# Patient Record
Sex: Male | Born: 1980 | Hispanic: Yes | Marital: Married | Smoking: Current every day smoker
Health system: Southern US, Community
[De-identification: ages and names within clinical notes are randomized; demographics above are authoritative.]

## PROBLEM LIST (undated history)

## (undated) DIAGNOSIS — R569 Unspecified convulsions: Secondary | ICD-10-CM

---

## 2015-07-30 ENCOUNTER — Encounter (HOSPITAL_BASED_OUTPATIENT_CLINIC_OR_DEPARTMENT_OTHER): Payer: Self-pay | Admitting: *Deleted

## 2015-07-30 ENCOUNTER — Emergency Department (HOSPITAL_BASED_OUTPATIENT_CLINIC_OR_DEPARTMENT_OTHER)
Admission: EM | Admit: 2015-07-30 | Discharge: 2015-07-30 | Disposition: A | Discharge status: Home / Self Care | Payer: Self-pay | Attending: Emergency Medicine | Admitting: Emergency Medicine

## 2015-07-30 DIAGNOSIS — F17210 Nicotine dependence, cigarettes, uncomplicated: Secondary | ICD-10-CM | POA: Insufficient documentation

## 2015-07-30 DIAGNOSIS — R200 Anesthesia of skin: Secondary | ICD-10-CM | POA: Insufficient documentation

## 2015-07-30 DIAGNOSIS — M5410 Radiculopathy, site unspecified: Secondary | ICD-10-CM

## 2015-07-30 DIAGNOSIS — M4726 Other spondylosis with radiculopathy, lumbar region: Secondary | ICD-10-CM | POA: Insufficient documentation

## 2015-07-30 MED ORDER — IBUPROFEN 400 MG PO TABS
600.0000 mg | ORAL_TABLET | Freq: Once | ORAL | Status: AC
  Administered 2015-07-30: 600 mg via ORAL
  Filled 2015-07-30: qty 1

## 2015-07-30 MED ORDER — PREDNISONE 10 MG PO TABS: 60.0000 mg | ORAL_TABLET | Freq: Every day | ORAL | Status: DC

## 2015-07-30 MED ORDER — DIAZEPAM 5 MG PO TABS
5.0000 mg | ORAL_TABLET | Freq: Once | ORAL | Status: AC
  Administered 2015-07-30: 5 mg via ORAL
  Filled 2015-07-30: qty 1

## 2015-07-30 MED ORDER — HYDROCODONE-ACETAMINOPHEN 5-325 MG PO TABS: 1.0000 | ORAL_TABLET | Freq: Four times a day (QID) | ORAL | Status: DC | PRN

## 2015-07-30 MED ORDER — IBUPROFEN 600 MG PO TABS: 600.0000 mg | ORAL_TABLET | Freq: Four times a day (QID) | ORAL | Status: DC | PRN

## 2015-07-30 MED ORDER — METHOCARBAMOL 500 MG PO TABS: 500.0000 mg | ORAL_TABLET | Freq: Two times a day (BID) | ORAL | Status: DC

## 2015-07-30 MED ORDER — HYDROCODONE-ACETAMINOPHEN 5-325 MG PO TABS
2.0000 | ORAL_TABLET | Freq: Once | ORAL | Status: AC
  Administered 2015-07-30: 2 via ORAL
  Filled 2015-07-30: qty 2

## 2015-07-30 NOTE — ED Notes (Signed)
C/o sneezing last pm and felt a crack in low back and pain is going down right leg.

## 2015-07-30 NOTE — Discharge Instructions (Signed)
We saw you in the ER for back pain, leg pain. We suspect disk disease or arthritis with impingement of the nerve causing the leg pain. Your pain improved and you are able to ambulate - so we are discharging you.  Please take the ibuprofen every 6 hours for the next 2 days, take the muscle relaxant as needed, and see your primary care doctor for further pain control.  Please use the back exercises to strengthen the back muscles.  Please return to the ER if your symptoms worsen; you have increased pain, numbness, weakness, urinary incontinence, urinary retention, bowel incontinence, tingling by your groin. [facility].  RESOURCE GUIDE  Chronic Pain Problems: Contact [facility]  [phone] Patients need to be referred by their primary care doctor.  Insufficient Money for Medicine: Contact United Way:  call "211."   No Primary Care Doctor: - Call [facility]  [phone] - can help you locate a primary care doctor that  accepts your insurance, provides certain services, etc. - Physician Referral Service- [phone]  Agencies that provide inexpensive medical care: - [facility]  [phone] - [facility]  [phone] - [facility]  [phone] - Women's Clinic  [phone] - [facility]  [phone] [facility]  [phone]  Medicaid-accepting [place] Providers: - [facility]- [address]  [phone], Mon-Fri 9am-7pm, Sat 9am-1pm - [facility]- [address] [place], [address] [place]  [phone] - [facility]- [address] [place]  [phone] [facility]- [address]  [phone] - Renaye Rakers- [address] [place], [address], [phone]  Only accepts [place] patients after they have their name  applied to their card  Self Pay (no insurance) in [place]: - Sickle Cell Patients: Dr Willey Blade, [facility]  [address] [place], [phone] - [facility]- [address] [place]  [phone]       [facility]- [address]       -     [facility]- see information above (Speak to [employer] if you do not have insurance)       -  [facility]- [address],  [phone]       -  [facility]- [address], [phone]       -  Dr Julio Sicks-  [address], [place], [phone]       -  [facility] - [address] [phone]       -  [facility]- [address] [phone], also [address] [phone]       -    [facility]- [address] [place], [phone], 1st & 3rd Saturday        every month, 10am-1pm  [facility] [address] [place], [place] [address] [phone]  [facility] [address] [place], [place] [address] [phone]  1) Find a Doctor and Pay Out of Pocket Although you won't have to find out who is covered by your insurance plan, it is a good idea to ask around and get recommendations. You will then need to call the office and see if the doctor  you have chosen will accept you as a new patient and what types of options they offer for patients who are self-pay. Some doctors offer discounts or will set up payment plans for their patients who do not have insurance, but you will need to ask so you aren't surprised when you get to your appointment.  2) Contact Your Local Health Department Not all health departments have doctors that can see patients for sick visits, but many do, so it is worth a call to see if yours does. If you don't know where your local health department is, you can check in your phone book. The CDC also has a tool to help you locate your state's health department, and many state websites also have listings of all of their local health  departments.  3) Find a Walk-in Clinic If your illness is not likely to be very severe or complicated, you may want to try a walk in clinic. These are popping up all over the country in pharmacies, drugstores, and shopping centers. They're usually staffed by nurse practitioners or physician assistants that have been trained to treat common illnesses and complaints. They're usually fairly quick and inexpensive. However, if you have serious medical issues or chronic medical problems, these are probably not your best option  STD Testing - [facility] Department of [facility], STD Clinic, [address] [place], phone [phone] or [phone].  Monday - Friday, call for an appointment. [facility] Department of [employer], STD Clinic, [place] [address], [place], phone [phone] or [phone].  Monday - Friday, call for an appointment.  Abuse/Neglect: [facility] Child Abuse Hotline [phone] [facility] Child Abuse Hotline 770-234-2455 (After Hours)  Emergency Shelter:  [facility] [phone]  Maternity Homes: - [facility] [phone] - [facility] [phone]  MRSA Hotline #:   367-283-4477  Dental Assistance If unable to pay or uninsured, contact:  [facility]. to become qualified for the adult dental clinic.  Patients with Medicaid: [facility] [phone] [address], [phone] [address] [phone]  If unable to pay, or uninsured, contact [facility] [phone] in [place], [phone] in [facility]) to become qualified for the adult dental clinic  [facility] [address] [place], [place] [address] [phone] [email or web address]  Other Proofreader Services: - [facility]- [address] [place], [place], [place], [address], [phone], Ext. [phone], 2nd and 4th Thursday of the  month at 6:30am.  10 clients each day by appointment, can sometimes see walk-in patients if someone does not show for an appointment. [facility]- [address] [place], [place], [address], [phone] - [facility]- [address] [place], [place], [address], [phone] [facility] Health Department- [phone] [facility] Health Department- [phone] [facility] [phone]          Ejercicios para la espalda (Back Exercises) Los siguientes ejercicios fortalecen los msculos que dan soporte a la espalda y, [place], ayudan a Pharmacologist la flexibilidad de la zona lumbar. Hacer estos ejercicios puede ser de ayuda para Psychologist, sport and exercise de espalda o Engineer, materials actual. Si tiene dolor o molestias en la espalda, intente hacer estos ejercicios 2 o 3veces por da, o como se lo haya indicado el mdico. Cuando el dolor desaparezca, hgalos una vez por da, pero haga ms  repeticiones de cada ejercicio. Si no tiene dolor o [employer] espalda, haga estos ejercicios una vez por da o como se lo haya indicado el mdico. EJERCICIOS Rodilla al pecho Repita estos pasos 3 o 5veces con cada pierna:  Acustese boca arriba sobre una cama dura o sobre el suelo con las piernas extendidas.  Lleve una rodilla al pecho. La otra pierna debe quedar extendida y en contacto con el suelo.  Mantenga la rodilla contra el pecho. Para lograrlo tmese la rodilla o el muslo.  Tire de la rodilla hasta sentir una elongacin suave en la parte baja de la espalda.  Mantenga la elongacin durante 10 a 30segundos.  Suelte y extienda la pierna lentamente. Inclinacin de la pelvis Repita estos pasos 5 o 10veces:  Acustese boca arriba sobre una cama dura o sobre el suelo con las piernas extendidas.  Flexione las rodillas de modo que apunten al techo y los pies queden apoyados en el suelo.  Contraiga los msculos de la parte baja del abdomen para  empujar la zona lumbar contra el suelo. Con este movimiento se inclinar la pelvis de modo que el cccix apunte hacia el techo, en lugar de apuntar en direccin a los pies o al suelo.  Contraiga suavemente y respire con normalidad mientras mantiene esta posicin durante 5 a 10segundos. El perro y el gato Repita estos pasos hasta que la zona lumbar se vuelva ms flexible:  Apoye las palmas de las manos y las rodillas sobre una superficie firme. Las manos deben estar alineadas con los hombros y las rodillas con las caderas. Puede colocarse almohadillas debajo de las rodillas para estar cmodo.  Deje caer la cabeza y baje el cccix en direccin al suelo de modo que la zona lumbar se arquee como el lomo de un [place].  Mantenga esta posicin durante 5segundos.  Lentamente, levante la cabeza y eleve el cccix de modo que apunte en direccin al techo para que la espalda forme un arco hundido como el lomo de un perro contento.  Mantenga esta posicin durante 5segundos. Flexiones de Public house manager pasos 5 o 10veces:  Acustese boca abajo en el suelo.  Coloque las palmas de las manos cerca de la cabeza, separadas aproximadamente al ancho de los hombros.  Con la espalda lo ms relajada posible y las caderas apoyadas en el suelo, extienda lentamente los brazos para levantar la mitad superior del cuerpo y Optometrist los hombros. No use los msculos de la espalda para elevar la parte superior del torso. Puede cambiar las manos de lugar para estar ms cmodo.  Mantenga esta posicin durante 5segundos mientras mantiene la espalda relajada.  Lentamente vuelva a la posicin horizontal. Puentes Repita estos pasos 10veces:  Acustese boca arriba sobre una superficie firme.  Flexione las rodillas de modo que apunten al techo y los pies queden apoyados en el suelo.  Contraiga los glteos y despegue las nalgas del suelo hasta que la cintura est casi a la misma altura que las rodillas. Debe  sentir el trabajo muscular en las nalgas y la parte posterior de los muslos. Si no siente el esfuerzo de [employer], aleje los pies 1 o 2pulgadas (2,5 o 5centmetros) de las nalgas.  Mantenga esta posicin durante 3 a 5segundos.  Baje lentamente las caderas a la posicin inicial y relaje los glteos por completo. Si este ejercicio le resulta muy fcil, intente realizarlo con los brazos cruzados [place]. Abdominales Repita estos pasos 5 o 10veces:  Acustese boca  arriba sobre una cama dura o sobre el suelo con las piernas extendidas.  Flexione las rodillas de modo que apunten al techo y los pies queden apoyados en el suelo.  Cruce los [employer] Corporation.  Baje levemente el mentn en direccin al pecho sin doblar el cuello.  Contraiga los msculos del abdomen y con lentitud eleve el torso lo suficiente como para Artist los omplatos del suelo. No eleve el torso ms alto que eso, porque esto puede sobreexigir a la zona lumbar y no ayuda a Investment banker, operational.  Regrese lentamente a la posicin inicial. Elevaciones de espalda Repita estos pasos 5 o 10veces:  Acustese boca abajo con los brazos a los costados del cuerpo y apoye la frente en el suelo.  Contraiga los msculos de las piernas y los glteos.  Lentamente despegue el pecho del suelo mientras mantiene las caderas bien apoyadas en el suelo. Mantenga la nuca alineada con la curvatura de la espalda. Los ojos deben mirar al suelo.  Mantenga esta posicin durante 3 a 5segundos.  Regrese lentamente a la posicin inicial. SOLICITE ATENCIN MDICA SI:  El dolor o las molestias en la espalda se vuelven mucho ms intensos cuando hace un ejercicio.  El dolor o las molestias en la espalda no se Copy en el trmino de las 2horas posteriores a Copy. Si tiene alguno de [employer], deje de [employer] ejercicios de inmediato. No vuelva a hacer los ejercicios a menos que el  mdico lo autorice. SOLICITE ATENCIN MDICA DE INMEDIATO SI:  Siente un dolor sbito e intenso en la espalda. Si esto ocurre, deje de [employer] ejercicios de inmediato. No vuelva a hacer los ejercicios a menos que el mdico lo autorice.   Esta informacin no tiene Theme park manager el consejo del mdico. Asegrese de hacerle al mdico cualquier pregunta que tenga.   Document Released: 05/31/2005 Document Revised: 02/19/2015 Elsevier Interactive Patient Education 2016 [employer].  Dolor Radicular (Radicular Pain) El dolor radicular est causado en la irritacin de las races [place]. Generalmente la causa es una degeneracin el un disco de la columna vertebral. Esto produce dolor que se siente en el brazo o la pierna. Entre otras causas del dolor radicular se incluyen:  Nurse, children's  Neuropatas (un estado anormal y a menudo degenerativo del sistema nervioso o nervios). Para el diagnstico le indicarn una tomografa computarizada o imgenes por resonancia magntica para determinar la causa primaria. Los nervios que comienzan en el cuello (races nerviosas) pueden Doctor, general practice radicular en la parte exterior del hombro y en el brazo. ste puede extenderse hacia el pulgar y los dedos. Los sntomas varan segn la raz nerviosa afectada. En la mayor parte de los casos mejora en gran medida con un tratamiento conservador. Para los problemas en el cuello le indicarn fisioterapia, un collar o una traccin cervical. El tratamiento puede durar varias semanas y se considerar la posibilidad de una ciruga si los sntomas no mejoran.  El tratamiento conservador tambin se recomienda para los casos de citica que causan dolor que se irradia desde la zona baja de la espalda o nalgas hacia la pierna o el pie. Generalmente hay una historia previa de problemas en la espalda. La mayora de los pacientes mejora completamente luego de 2 a 4 semanas de reposo en cama y  otros tratamientos de apoyo. El reposo en cama reduce la presin en el disco considerablemente. El estar sentado no es una buena posicin,  ya que aumenta la presin [employer] disco. Evite encorvarse, levantar mucho peso, Personal assistant sentado por [place], y las actividades que puedan empeorar el problema. El los casos ms graves se Magazine features editor traccin. La ciruga est reservada para los [address] no mejoran dentro de los primeros meses de [place]. Utilice los medicamentos de venta libre o de prescripcin para Chief Technology Officer, Environmental health practitioner o la [place], segn se lo indique el profesional que lo asiste. Los narcticos y los relajantes musculares ayudan a Teacher, early years/pre dolor ms intenso y los espasmos, proporcionando una sedacin [place]. Tressie Stalker de fro o [place] tambin le brindarn [place]. No se recomienda la manipulacin de la columna vertebral. Esto puede aumentar el grado de protrusin del disco. Las inyecciones epidurales con esteroides son a menudo un tratamiento efectivo para el dolor radicular. Esas inyecciones colocan medicamentos al nervio espinal en el espacio entre la cubierta protectora de la mdula espinal y los huesos de la espalda (vertebrae). El profesional que lo asiste podr darle ms informacin acerca de las inyecciones de esteroides. Estas inyecciones son ms efectivas cuando se administran dentro de las [employer] de la aparicin del Engineer, mining.  Comunquese con su mdico para realizar un control segn las indicaciones. Como parte importante del tratamiento deber seguir un programa de rehabilitacin, con ejercicios de elongacin y fortalecimiento.  SOLICITE ATENCIN MDICA DE INMEDIATO SI:  Presenta siente debilidad o adormecimiento inusual en sus brazos o piernas.  Presenta prdida del control del intestino o de la vejiga.  Presenta dolor abdominal.   Esta informacin no tiene como fin reemplazar el consejo del mdico. Asegrese de hacerle al mdico cualquier pregunta que tenga.     Document Released: 05/31/2005 Document Revised: 08/23/2011 Elsevier Interactive Patient Education 2016 Elsevier Inc.  Radiculopata lumbosacra (Lumbosacral Radiculopathy) La radiculopata lumbosacra es una afeccin que compromete los nervios raqudeos, y las races nerviosas de la parte baja de la espalda y Chief Operating Officer. La afeccin se manifiesta cuando estos nervios y las races nerviosas se desplazan o se inflaman y causan sntomas. CAUSAS Este trastorno puede ser causado por:  Presin en un disco que sobresale de su lugar (hernia de disco). Un disco es una placa de cartlago que separa los huesos de la columna.  Degeneracin discal.  Un estrechamiento de los huesos de la parte baja de la espalda (estenosis del conducto raqudeo).  Un tumor.  Una infeccin.  Una lesin que ejerce una presin repentina en los discos que amortiguan los huesos de la parte ms baja de la columna. FACTORES DE RIESGO Es ms probable que esta afeccin se manifieste en:  Los hombres que [employer] Holdings 30 y [id number].  Las mujeres que tienen entre 50 y 60AVW.  Las personas que levantan cargas de forma inadecuada.  Las personas con sobrepeso o que llevan un estilo de vida sedentario.  Los fumadores.  Las personas que realizan actividades repetitivas que tensionan la columna vertebral. SNTOMAS Los sntomas de esta afeccin incluyen lo siguiente:  Dolor que baja por la espalda hasta las piernas (citica). Este es el sntoma ms frecuente. El dolor puede empeorar al sentarse, toser o estornudar.  Dolor y Physicist, medical los brazos y la piernas.  Debilidad muscular.  Hormigueo.  Prdida del control del intestino o de la vejiga. DIAGNSTICO Esta afeccin se diagnostica mediante un examen fsico y la historia clnica. Si el dolor es prolongado, pueden hacerle estudios, por ejemplo:  Health visitor.  Radiografas.  Tomografa computarizada.  Mielografa.  Estudio de [employer]. TRATAMIENTO  Esta afeccin suele tratarse con lo siguiente:  Aplicacin de compresas calientes y fras en las zonas afectadas.  Estiramientos para mejorar la flexibilidad.  Ejercicios para fortalecer los msculos de la espalda.  Fisioterapia.  Analgsicos.  Una inyeccin de corticoides en la columna vertebral. En algunos los casos, no se necesita tratamiento. Si la afeccin es de larga duracin (crnica) o si los sntomas son intensos, el tratamiento puede incluir ciruga o cambios en el estilo de vida, como seguir un plan para bajar de peso. INSTRUCCIONES PARA EL CUIDADO EN EL HOGAR Medicamentos  Tome los medicamentos solamente como se lo haya indicado el mdico.  No conduzca ni opere maquinaria pesada mientras toma analgsicos. Cuidado de la lesin  Aplique una compresa caliente en la zona lesionada como le haya indicado el mdico.  Aplique hielo en la zona afectada:  Ponga el hielo en una bolsa plstica.  Coloque una toalla entre la piel y la bolsa de hielo.  Deje el hielo durante 20 a , cada 2horas, mientras est despierto o cuando lo necesite. O bien, djelo durante todo el [employer] le haya indicado el mdico. Otras indicaciones  Si le explicaron cmo hacer ejercicios o estiramientos, hgalos como se lo haya indicado el mdico.  Si el mdico le recomend una dieta o un programa de ejercicios, cmplalos como se lo hayan indicado.  Concurra a todas las visitas de control como se lo haya indicado el mdico. Esto es importante. SOLICITE ATENCIN MDICA SI:  El dolor no mejora con el tiempo, incluso si est tomando analgsicos. SOLICITE ATENCIN MDICA DE INMEDIATO SI:  Siente un dolor intenso.  El dolor empeora repentinamente.  Aumenta la debilidad en las piernas.  Pierde la capacidad de controlar la vejiga o el intestino.  Tiene dificultad para caminar o mantener el equilibrio.  Tiene fiebre.   Esta informacin no tiene Microbiologist el consejo del mdico. Asegrese de hacerle al mdico cualquier pregunta que tenga.   Document Released: 03/10/2005 Document Revised: 10/15/2014 Elsevier Interactive Patient Education [employer]

## 2015-07-30 NOTE — ED Provider Notes (Signed)
CSN: [id number]     Arrival date & time 07/30/15  4098 History   First MD Initiated Contact with Patient 07/30/15 0940     Chief Complaint  Patient presents with  . Back Pain     (Consider location/radiation/quality/duration/timing/severity/associated sxs/prior Treatment) HPI Comments: Pt comes in with cc of back pain and leg pain. Pt was on the cough yday, has a seemingly violent sneeze. Post sneezing pt started having severe back pain and leg pain. Pt's pain moves down the back into the anterior thigh and down the leg to the toes. The pain is sharp. He has some numbness with the pain in the anterior part as well. No associated weakness, urinary incontinence, urinary retention, bowel incontinence, saddle anesthesia. Pt was having difficulty getting out of the bed today, so he was brought tot he ER. Tylenol tried at home with minimal relief. Pt has no hx of back problems.   ROS 10 Systems reviewed and are negative for acute change except as noted in the HPI.       The history is provided by the patient.    History reviewed. No pertinent past medical history. History reviewed. No pertinent past surgical history. No family history on file. Social History  Substance Use Topics  . Smoking status: Current Every Day Smoker -- 0.50 packs/day    Types: Cigarettes  . Smokeless tobacco: None  . Alcohol Use: None    Review of Systems    Allergies  Review of patient's allergies indicates no known allergies.  Home Medications   Prior to Admission medications   Medication Sig Start Date End Date Taking? Authorizing Provider  HYDROcodone-acetaminophen (NORCO/VICODIN) 5-325 MG tablet Take 1 tablet by mouth every 6 (six) hours as needed. 07/30/15   Derwood Kaplan, MD  ibuprofen (ADVIL,MOTRIN) 600 MG tablet Take 1 tablet (600 mg total) by mouth every 6 (six) hours as needed. 07/30/15   Derwood Kaplan, MD  methocarbamol (ROBAXIN) 500 MG tablet Take 1 tablet (500 mg total) by mouth 2 (two)  times daily. 07/30/15   Derwood Kaplan, MD  predniSONE (DELTASONE) 10 MG tablet Take 6 tablets (60 mg total) by mouth daily. 07/30/15   Careena Degraffenreid Rhunette Croft, MD   BP 91/69 mmHg  Pulse 74  Temp(Src) 97.9 F (36.6 C) (Oral)  Resp 18  Ht  (1.727 m)  Wt 205 lb (92.987 kg)  BMI 31.18 kg/m2  SpO2 100% Physical Exam  Constitutional: He is oriented to person, place, and time. He appears well-developed.  HENT:  Head: Normocephalic and atraumatic.  Eyes: Conjunctivae and EOM are normal. Pupils are equal, round, and reactive to light.  Neck: Normal range of motion. Neck supple.  Cardiovascular: Normal rate, regular rhythm and normal heart sounds.   Pulmonary/Chest: Effort normal and breath sounds normal. No respiratory distress. He has no wheezes.  Abdominal: Soft. Bowel sounds are normal. He exhibits no distension. There is no tenderness. There is no guarding.  Musculoskeletal:  Pt has tenderness over the lumbar region No step offs, no erythema. Pt has 2+ patellar reflex bilaterally bilaterally -no hyperreflexia noted. Able to discriminate between sharp and dull. Pt has subjective numbness in the anterior thigh - but distal to that sensation is normal and equal to the contralateral side.    Neurological: He is alert and oriented to person, place, and time.  Skin: Skin is warm.  Nursing note and vitals reviewed.   ED Course  Procedures (including critical care time)  :40 pm: pt is not sitting upright.  He is able to lay flat and get up on his own. Pain still present, hasnt completely resolved, but he feels a lot better. Strict ER return precautions discussed. F/u info provided.  Labs Review Labs Reviewed - No data to display  Imaging Review No results found. I have personally reviewed and evaluated these images and lab results as part of my medical decision-making.   EKG Interpretation None      MDM   Final diagnoses:  Radicular pain  Osteoarthritis of spine with  radiculopathy, lumbar region    Pt comes in with cc of back pain. Sudden onset, after sneezing, with some numbness and pain radiating to the leg.  DDx includes: - DJD of the back - Spondylitises/ spondylosis - Disk hernia - Sciatica - Spinal cord compression - Conus medullaris - Myelitis - Musculoskeletal pain  Based on the exam - disk hernia, rupture and radicular pain from DJD possible. Pt able to get up and ambulate, he is limping. When laying supine, he is comfortable, sitting up hurts. Has no insurance or pcp.  Plan is to give him oral meds and reassess. If pain and pt improve dramatically, we will d/c with strict return precautions to cone and f/u info. If pt doesn't improve - we will transfer him to [facility] for MRI to r/o disk rupture.      Derwood Kaplan, MD 07/30/15 1222

## 2021-10-15 ENCOUNTER — Emergency Department (HOSPITAL_BASED_OUTPATIENT_CLINIC_OR_DEPARTMENT_OTHER): Payer: Self-pay

## 2021-10-15 ENCOUNTER — Emergency Department (HOSPITAL_BASED_OUTPATIENT_CLINIC_OR_DEPARTMENT_OTHER)
Admission: EM | Admit: 2021-10-15 | Discharge: 2021-10-15 | Disposition: A | Discharge status: Home / Self Care | Payer: Self-pay | Attending: Emergency Medicine | Admitting: Emergency Medicine

## 2021-10-15 ENCOUNTER — Other Ambulatory Visit: Payer: Self-pay

## 2021-10-15 ENCOUNTER — Encounter (HOSPITAL_BASED_OUTPATIENT_CLINIC_OR_DEPARTMENT_OTHER): Payer: Self-pay

## 2021-10-15 DIAGNOSIS — M25571 Pain in right ankle and joints of right foot: Secondary | ICD-10-CM | POA: Insufficient documentation

## 2021-10-15 DIAGNOSIS — S82891A Other fracture of right lower leg, initial encounter for closed fracture: Secondary | ICD-10-CM

## 2021-10-15 DIAGNOSIS — W010XXA Fall on same level from slipping, tripping and stumbling without subsequent striking against object, initial encounter: Secondary | ICD-10-CM | POA: Insufficient documentation

## 2021-10-15 DIAGNOSIS — S92154A Nondisplaced avulsion fracture (chip fracture) of right talus, initial encounter for closed fracture: Secondary | ICD-10-CM | POA: Insufficient documentation

## 2021-10-15 MED ORDER — HYDROCODONE-ACETAMINOPHEN 5-325 MG PO TABS
1.0000 | ORAL_TABLET | Freq: Once | ORAL | Status: AC
  Administered 2021-10-15: 1 via ORAL
  Filled 2021-10-15: qty 1

## 2021-10-15 MED ORDER — TRAMADOL HCL 50 MG PO TABS: 50.0000 mg | ORAL_TABLET | Freq: Four times a day (QID) | ORAL | 0 refills | Status: DC | PRN

## 2021-10-15 NOTE — ED Triage Notes (Signed)
Pt fell yesterday, injured right ankle. Pain & swelling noted. ?

## 2021-10-15 NOTE — ED Provider Notes (Signed)
?[facility] EMERGENCY DEPARTMENT ?Provider Note ? ? ?CSN: [id number] ?Arrival date & time: 10/15/21  1736 ? ?  ? ?History ? ?Chief Complaint  ?Patient presents with  ? Ankle Injury  ? ? ?Antonio Flores is a 41 y.o. male here for evaluation of right lower extremity pain after trip and fall yesterday.  Inverted his ankle.  He has pain to his medial lateral malleolus on his ankle as well as mid shaft, distal tib-fib.  Noted swelling.  No redness or warmth.  Unable to ambulate due to the pain.  No numbness or weakness.  Denies hitting head, LOC or anticoagulation ? ?HPI ? ?  ? ?Home Medications ?Prior to Admission medications   ?Medication Sig Start Date End Date Taking? Authorizing Provider  ?traMADol (ULTRAM) 50 MG tablet Take 1 tablet (50 mg total) by mouth every 6 (six) hours as needed. 10/15/21  Yes Hazem Kenner A, PA-C  ?HYDROcodone-acetaminophen (NORCO/VICODIN) 5-325 MG tablet Take 1 tablet by mouth every 6 (six) hours as needed. 07/30/15   Derwood Kaplan, MD  ?ibuprofen (ADVIL,MOTRIN) 600 MG tablet Take 1 tablet (600 mg total) by mouth every 6 (six) hours as needed. 07/30/15   Derwood Kaplan, MD  ?methocarbamol (ROBAXIN) 500 MG tablet Take 1 tablet (500 mg total) by mouth 2 (two) times daily. 07/30/15   Derwood Kaplan, MD  ?predniSONE (DELTASONE) 10 MG tablet Take 6 tablets (60 mg total) by mouth daily. 07/30/15   Derwood Kaplan, MD  ?   ? ?Allergies    ?Patient has no known allergies.   ? ?Review of Systems   ?Review of Systems  ?Constitutional: Negative.   ?HENT: Negative.    ?Respiratory: Negative.    ?Cardiovascular: Negative.   ?Genitourinary: Negative.   ?Musculoskeletal:   ?     Right ankle, tib fib pain  ?Skin: Negative.   ?Neurological: Negative.   ?All other systems reviewed and are negative. ? ?Physical Exam ?Updated Vital Signs ?BP 125/89   Pulse 73   Temp 98.1 ?F (36.7 ?C) (Oral)   Resp 17   Ht 5\' 8"  (1.727 m)   Wt 93 kg   SpO2 99%   BMI 31.17 kg/m?  ?Physical  Exam ?Vitals and nursing note reviewed.  ?Constitutional:   ?   General: He is not in acute distress. ?   Appearance: He is well-developed. He is not ill-appearing, toxic-appearing or diaphoretic.  ?HENT:  ?   Head: Atraumatic.  ?Eyes:  ?   Pupils: Pupils are equal, round, and reactive to light.  ?Cardiovascular:  ?   Rate and Rhythm: Normal rate and regular rhythm.  ?   Pulses:     ?     Dorsalis pedis pulses are 2+ on the right side and 2+ on the left side.  ?   Heart sounds: Normal heart sounds.  ?Pulmonary:  ?   Effort: Pulmonary effort is normal. No respiratory distress.  ?Abdominal:  ?   General: There is no distension.  ?   Palpations: Abdomen is soft.  ?Musculoskeletal:     ?   General: Normal range of motion.  ?   Cervical back: Normal range of motion and neck supple.  ?     Feet: ? ?   Comments: Soft tissue swelling about right ankle.  No bony tenderness to foot, diffuse tenderness medial and lateral malleolus as well as distal tib-fib on right.  No bony tenderness of proximal tib-fib, knee, femur bilaterally able to plantarflex, dorsiflex however has pain  with inversion and eversion right ankle  ?Feet:  ?   Right foot:  ?   Skin integrity: Skin integrity normal.  ?   Left foot:  ?   Skin integrity: Skin integrity normal.  ?Skin: ?   General: Skin is warm and dry.  ?   Capillary Refill: Capillary refill takes less than 2 seconds.  ?   Comments: Soft tissue swelling about right ankle, no erythema, warmth  ?Neurological:  ?   General: No focal deficit present.  ?   Mental Status: He is alert and oriented to person, place, and time.  ?   Comments: Intact sensation ?Decreased strength with plantarflexion dorsiflexion right ankle likely secondary to pain ?Ambulatory with limp  ? ? ?ED Results / Procedures / Treatments   ?Labs ?(all labs ordered are listed, but only abnormal results are displayed) ?Labs Reviewed - No data to display ? ?EKG ?None ? ?Radiology ?DG Tibia/Fibula Right ? ?Result Date:  10/15/2021 ?CLINICAL DATA:  Fall, right leg pain EXAM: RIGHT TIBIA AND FIBULA - 2 VIEW COMPARISON:  None Available. FINDINGS: No fracture or dislocation is seen. The joint spaces are preserved. Visualized soft tissues are within normal limits. No suprapatellar knee joint effusion. IMPRESSION: Negative. Electronically Signed   By: Charline Bills M.D.   On: 10/15/2021 19:28  ? ?DG Ankle Complete Right ? ?Result Date: 10/15/2021 ?CLINICAL DATA:  Fall with ankle pain/swelling. EXAM: RIGHT ANKLE - COMPLETE 3+ VIEW COMPARISON:  None Available. FINDINGS: Osseous focus along the distal aspect of the medial malleolus. Small joint effusion. Soft tissue swelling about the ankle. Superior calcaneal enthesophyte. Mild dorsal midfoot degenerative change. IMPRESSION: 1. Osseous focus along the distal aspect of the medial malleolus may reflect a small avulsion or sequela of prior injury, recommend correlation with point tenderness. 2. Soft tissue swelling about the ankle with small joint effusion. Electronically Signed   By: Maudry Mayhew M.D.   On: 10/15/2021 18:43   ? ?Procedures ?Marland KitchenSplint Application ? ?Date/Time: 10/15/2021 7:41 PM ?Performed by: Ralph Leyden A, PA-C ?Authorized by: Linwood Dibbles, PA-C  ? ?Consent:  ?  Consent obtained:  Verbal ?  Consent given by:  Patient ?  Risks, benefits, and alternatives were discussed: yes   ?  Risks discussed:  Discoloration, numbness, pain and swelling ?  Alternatives discussed:  Referral, observation, alternative treatment, delayed treatment and no treatment ?Universal protocol:  ?  Procedure explained and questions answered to patient or proxy's satisfaction: yes   ?  Relevant documents present and verified: yes   ?  Test results available: yes   ?  Imaging studies available: yes   ?  Required blood products, implants, devices, and special equipment available: yes   ?  Site/side marked: yes   ?  Immediately prior to procedure a time out was called: yes   ?  Patient identity  confirmed:  Verbally with patient ?Pre-procedure details:  ?  Distal neurologic exam:  Normal ?  Distal perfusion: distal pulses strong and brisk capillary refill   ?Procedure details:  ?  Location:  Ankle ?  Ankle location:  R ankle ?  Strapping: no   ?  Cast type:  Short leg ?  Splint type:  Ankle stirrup ?  Supplies:  Prefabricated splint ?  Attestation: Splint applied and adjusted personally by me   ?Post-procedure details:  ?  Distal neurologic exam:  Normal ?  Distal perfusion: distal pulses strong and brisk capillary refill   ?  Procedure  completion:  Tolerated well, no immediate complications ?  Post-procedure imaging: not applicable    ? ? ?Medications Ordered in ED ?Medications  ?HYDROcodone-acetaminophen (NORCO/VICODIN) 5-325 MG per tablet 1 tablet (1 tablet Oral Given 10/15/21 1926)  ? ? ?ED Course/ Medical Decision Making/ A&P ?  ? ?41 year old here for evaluation of right lower extremity pain after mechanical fall yesterday.  He is neurovascularly intact.  Denies hitting head, LOC or anticoagulation.  No midline C/T/L tenderness he has diffuse tenderness soft tissue swelling about his right ankle.  Some weakness with plantarflexion dorsiflexion with I suspect is likely due to pain.  He has no pain to proximal tib-fib. ? ?Imaging personally viewed and interpreted: ? ?X-ray right ankle with medial malleolus avulsion fracture ?X-ray right tib-fib without significant normality ? ?Patient placed in splint, crutches.  Discussed RICE for symptomatic management.  We will have him follow-up outpatient with orthopedics. ? ?The patient has been appropriately medically screened and/or stabilized in the ED. I have low suspicion for any other emergent medical condition which would require further screening, evaluation or treatment in the ED or require inpatient management. ? ?Patient is hemodynamically stable and in no acute distress.  Patient able to ambulate in department prior to ED.  Evaluation does not show acute  pathology that would require ongoing or additional emergent interventions while in the emergency department or further inpatient treatment.  I have discussed the diagnosis with the patient and answered all questions.  Pain is been man

## 2021-10-15 NOTE — Discharge Instructions (Addendum)
You have a small avulsion fracture which is likely due to a torn ligament or tendon in your ankle.  We have placed you in a splint and provided you with crutches.  You may take Tylenol, Motrin as needed for pain.  Make sure to ice and elevate the leg.  I have written for a short course of tramadol which is a narcotic pain medicine to help with your symptoms if the Tylenol Motrin does not work.  Use caution as this is a narcotic pain medicine and does have the addictive potential.  Do not drive or operate heavy machinery while taking this medicine. ?

## 2022-10-13 ENCOUNTER — Inpatient Hospital Stay (HOSPITAL_COMMUNITY): Payer: Managed Care, Other (non HMO)

## 2022-10-13 ENCOUNTER — Emergency Department (HOSPITAL_COMMUNITY): Payer: Managed Care, Other (non HMO)

## 2022-10-13 ENCOUNTER — Encounter (HOSPITAL_COMMUNITY): Payer: Self-pay

## 2022-10-13 ENCOUNTER — Inpatient Hospital Stay (HOSPITAL_COMMUNITY): Payer: Managed Care, Other (non HMO) | Admitting: Certified Registered"

## 2022-10-13 ENCOUNTER — Encounter (HOSPITAL_COMMUNITY)
Admission: EM | Disposition: A | Discharge status: Home / Self Care | Payer: Self-pay | Source: Home / Self Care | Attending: Orthopaedic Surgery

## 2022-10-13 ENCOUNTER — Inpatient Hospital Stay (HOSPITAL_COMMUNITY)
Admission: EM | Admit: 2022-10-13 | Discharge: 2022-10-20 | DRG: 493 | Disposition: A | Discharge status: Home / Self Care | Payer: Managed Care, Other (non HMO) | Attending: Student | Admitting: Student

## 2022-10-13 ENCOUNTER — Other Ambulatory Visit: Payer: Self-pay

## 2022-10-13 DIAGNOSIS — S92151A Displaced avulsion fracture (chip fracture) of right talus, initial encounter for closed fracture: Secondary | ICD-10-CM | POA: Diagnosis not present

## 2022-10-13 DIAGNOSIS — S82301A Unspecified fracture of lower end of right tibia, initial encounter for closed fracture: Secondary | ICD-10-CM | POA: Diagnosis present

## 2022-10-13 DIAGNOSIS — F17210 Nicotine dependence, cigarettes, uncomplicated: Secondary | ICD-10-CM

## 2022-10-13 DIAGNOSIS — S80212A Abrasion, left knee, initial encounter: Secondary | ICD-10-CM | POA: Diagnosis present

## 2022-10-13 DIAGNOSIS — S82451A Displaced comminuted fracture of shaft of right fibula, initial encounter for closed fracture: Secondary | ICD-10-CM | POA: Diagnosis present

## 2022-10-13 DIAGNOSIS — D62 Acute posthemorrhagic anemia: Secondary | ICD-10-CM | POA: Diagnosis not present

## 2022-10-13 DIAGNOSIS — W208XXA Other cause of strike by thrown, projected or falling object, initial encounter: Secondary | ICD-10-CM | POA: Diagnosis present

## 2022-10-13 DIAGNOSIS — W14XXXA Fall from tree, initial encounter: Secondary | ICD-10-CM | POA: Diagnosis not present

## 2022-10-13 DIAGNOSIS — M25471 Effusion, right ankle: Secondary | ICD-10-CM | POA: Diagnosis present

## 2022-10-13 DIAGNOSIS — S82871A Displaced pilon fracture of right tibia, initial encounter for closed fracture: Principal | ICD-10-CM | POA: Diagnosis present

## 2022-10-13 HISTORY — PX: EXTERNAL FIXATION LEG: SHX1549

## 2022-10-13 HISTORY — DX: Unspecified convulsions: R56.9

## 2022-10-13 LAB — BASIC METABOLIC PANEL
Anion gap: 8 (ref 5–15)
BUN: 12 mg/dL (ref 6–20)
CO2: 24 mmol/L (ref 22–32)
Calcium: 8.8 mg/dL — ABNORMAL LOW (ref 8.9–10.3)
Chloride: 104 mmol/L (ref 98–111)
Creatinine, Ser: 0.84 mg/dL (ref 0.61–1.24)
GFR, Estimated: >60 mL/min (ref >60)
Glucose, Bld: 97 mg/dL (ref 70–99)
Potassium: 3.3 mmol/L — ABNORMAL LOW (ref 3.5–5.1)
Sodium: 136 mmol/L (ref 135–145)

## 2022-10-13 LAB — CBC
HCT: 40.3 % (ref 39.0–52.0)
Hemoglobin: 14.4 g/dL (ref 13.0–17.0)
MCH: 30.8 pg (ref 26.0–34.0)
MCHC: 35.7 g/dL (ref 30.0–36.0)
MCV: 86.3 fL (ref 80.0–100.0)
Platelets: 142 10*3/uL — ABNORMAL LOW (ref 150–400)
RBC: 4.67 MIL/uL (ref 4.22–5.81)
RDW: 12.4 % (ref 11.5–15.5)
WBC: 14.5 10*3/uL — ABNORMAL HIGH (ref 4.0–10.5)
nRBC: 0 % (ref 0.0–0.2)

## 2022-10-13 LAB — SURGICAL PCR SCREEN
MRSA, PCR: NEGATIVE
Staphylococcus aureus: NEGATIVE

## 2022-10-13 SURGERY — EXTERNAL FIXATION, LOWER EXTREMITY
Anesthesia: General | Site: Ankle | Laterality: Right

## 2022-10-13 MED ORDER — POVIDONE-IODINE 10 % EX SWAB
2.0000 | Freq: Once | CUTANEOUS | Status: AC
  Administered 2022-10-13: 2 via TOPICAL

## 2022-10-13 MED ORDER — CEFAZOLIN SODIUM-DEXTROSE 2-4 GM/100ML-% IV SOLN
2.0000 g | Freq: Three times a day (TID) | INTRAVENOUS | Status: AC
  Administered 2022-10-14 (×2): 2 g via INTRAVENOUS
  Filled 2022-10-13 (×2): qty 100

## 2022-10-13 MED ORDER — ONDANSETRON HCL 4 MG/2ML IJ SOLN: 4.0000 mg | Freq: Four times a day (QID) | INTRAMUSCULAR | Status: DC | PRN

## 2022-10-13 MED ORDER — MORPHINE SULFATE (PF) 2 MG/ML IV SOLN
2.0000 mg | INTRAVENOUS | Status: DC | PRN
  Administered 2022-10-13: 2 mg via INTRAVENOUS
  Filled 2022-10-13: qty 1

## 2022-10-13 MED ORDER — METHOCARBAMOL 500 MG PO TABS
500.0000 mg | ORAL_TABLET | Freq: Four times a day (QID) | ORAL | Status: DC | PRN
  Administered 2022-10-13 – 2022-10-20 (×12): 500 mg via ORAL
  Filled 2022-10-13 (×12): qty 1

## 2022-10-13 MED ORDER — ETOMIDATE 2 MG/ML IV SOLN
7.0000 mg | Freq: Once | INTRAVENOUS | Status: AC
  Administered 2022-10-13: 7 mg via INTRAVENOUS
  Filled 2022-10-13: qty 10

## 2022-10-13 MED ORDER — MIDAZOLAM HCL 2 MG/2ML IJ SOLN
INTRAMUSCULAR | Status: DC | PRN
  Administered 2022-10-13: 2 mg via INTRAVENOUS

## 2022-10-13 MED ORDER — MORPHINE SULFATE (PF) 4 MG/ML IV SOLN
4.0000 mg | INTRAVENOUS | Status: DC | PRN
  Administered 2022-10-13 – 2022-10-18 (×8): 4 mg via INTRAVENOUS
  Filled 2022-10-13 (×8): qty 1

## 2022-10-13 MED ORDER — HYDROMORPHONE HCL 1 MG/ML IJ SOLN
1.0000 mg | Freq: Once | INTRAMUSCULAR | Status: AC
  Administered 2022-10-13: 1 mg via INTRAVENOUS
  Filled 2022-10-13: qty 1

## 2022-10-13 MED ORDER — OXYCODONE HCL 5 MG PO TABS
5.0000 mg | ORAL_TABLET | ORAL | Status: DC | PRN
  Administered 2022-10-13: 15 mg via ORAL
  Filled 2022-10-13: qty 3

## 2022-10-13 MED ORDER — CEFAZOLIN SODIUM-DEXTROSE 2-4 GM/100ML-% IV SOLN
2.0000 g | INTRAVENOUS | Status: AC
  Administered 2022-10-13: 2 g via INTRAVENOUS
  Filled 2022-10-13: qty 100

## 2022-10-13 MED ORDER — 0.9 % SODIUM CHLORIDE (POUR BTL) OPTIME
TOPICAL | Status: DC | PRN
  Administered 2022-10-13: 1000 mL

## 2022-10-13 MED ORDER — DIAZEPAM 5 MG PO TABS
5.0000 mg | ORAL_TABLET | Freq: Once | ORAL | Status: AC
  Administered 2022-10-13: 5 mg via ORAL
  Filled 2022-10-13: qty 1

## 2022-10-13 MED ORDER — FENTANYL CITRATE (PF) 250 MCG/5ML IJ SOLN
INTRAMUSCULAR | Status: DC | PRN
  Administered 2022-10-13: 100 ug via INTRAVENOUS
  Administered 2022-10-13 (×2): 50 ug via INTRAVENOUS

## 2022-10-13 MED ORDER — FENTANYL CITRATE (PF) 250 MCG/5ML IJ SOLN
INTRAMUSCULAR | Status: AC
  Filled 2022-10-13: qty 5

## 2022-10-13 MED ORDER — CHLORHEXIDINE GLUCONATE 4 % EX SOLN: 60.0000 mL | Freq: Once | CUTANEOUS | Status: DC

## 2022-10-13 MED ORDER — ENOXAPARIN SODIUM 40 MG/0.4ML IJ SOSY
40.0000 mg | PREFILLED_SYRINGE | INTRAMUSCULAR | Status: DC
  Administered 2022-10-14 – 2022-10-16 (×3): 40 mg via SUBCUTANEOUS
  Filled 2022-10-13 (×3): qty 0.4

## 2022-10-13 MED ORDER — LACTATED RINGERS IV SOLN: INTRAVENOUS | Status: DC | PRN

## 2022-10-13 MED ORDER — ETOMIDATE 2 MG/ML IV SOLN
INTRAVENOUS | Status: AC | PRN
  Administered 2022-10-13: 3 mg via INTRAVENOUS

## 2022-10-13 MED ORDER — FENTANYL CITRATE (PF) 100 MCG/2ML IJ SOLN
25.0000 ug | INTRAMUSCULAR | Status: DC | PRN
  Administered 2022-10-13 (×2): 50 ug via INTRAVENOUS

## 2022-10-13 MED ORDER — PROPOFOL 10 MG/ML IV BOLUS
INTRAVENOUS | Status: DC | PRN
  Administered 2022-10-13: 200 mg via INTRAVENOUS

## 2022-10-13 MED ORDER — METHOCARBAMOL 1000 MG/10ML IJ SOLN: 500.0000 mg | Freq: Four times a day (QID) | INTRAVENOUS | Status: DC | PRN

## 2022-10-13 MED ORDER — ONDANSETRON HCL 4 MG/2ML IJ SOLN
4.0000 mg | Freq: Once | INTRAMUSCULAR | Status: AC
  Administered 2022-10-13: 4 mg via INTRAVENOUS
  Filled 2022-10-13: qty 2

## 2022-10-13 MED ORDER — PROPOFOL 10 MG/ML IV BOLUS
INTRAVENOUS | Status: AC
  Filled 2022-10-13: qty 20

## 2022-10-13 MED ORDER — ORAL CARE MOUTH RINSE: 15.0000 mL | Freq: Once | OROMUCOSAL | Status: AC

## 2022-10-13 MED ORDER — SODIUM CHLORIDE 0.9 % IV SOLN
INTRAVENOUS | Status: AC | PRN
  Administered 2022-10-13: 500 mL/h via INTRAVENOUS

## 2022-10-13 MED ORDER — OXYCODONE HCL 5 MG/5ML PO SOLN: 5.0000 mg | Freq: Once | ORAL | Status: DC | PRN

## 2022-10-13 MED ORDER — FENTANYL CITRATE (PF) 100 MCG/2ML IJ SOLN
INTRAMUSCULAR | Status: AC
  Filled 2022-10-13: qty 2

## 2022-10-13 MED ORDER — CHLORHEXIDINE GLUCONATE 0.12 % MT SOLN
OROMUCOSAL | Status: AC
  Administered 2022-10-13: 15 mL via OROMUCOSAL
  Filled 2022-10-13: qty 15

## 2022-10-13 MED ORDER — OXYCODONE HCL 5 MG PO TABS
10.0000 mg | ORAL_TABLET | ORAL | Status: DC | PRN
  Administered 2022-10-13 – 2022-10-17 (×17): 20 mg via ORAL
  Administered 2022-10-18: 15 mg via ORAL
  Administered 2022-10-18 (×2): 20 mg via ORAL
  Administered 2022-10-19: 10 mg via ORAL
  Administered 2022-10-19 – 2022-10-20 (×3): 20 mg via ORAL
  Filled 2022-10-13 (×5): qty 4
  Filled 2022-10-13: qty 2
  Filled 2022-10-13 (×2): qty 4
  Filled 2022-10-13: qty 3
  Filled 2022-10-13: qty 4
  Filled 2022-10-13: qty 2
  Filled 2022-10-13 (×13): qty 4
  Filled 2022-10-13: qty 2

## 2022-10-13 MED ORDER — CHLORHEXIDINE GLUCONATE 0.12 % MT SOLN: 15.0000 mL | Freq: Once | OROMUCOSAL | Status: AC

## 2022-10-13 MED ORDER — LIDOCAINE 2% (20 MG/ML) 5 ML SYRINGE
INTRAMUSCULAR | Status: DC | PRN
  Administered 2022-10-13: 60 mg via INTRAVENOUS

## 2022-10-13 MED ORDER — ONDANSETRON HCL 4 MG/2ML IJ SOLN
INTRAMUSCULAR | Status: DC | PRN
  Administered 2022-10-13: 4 mg via INTRAVENOUS

## 2022-10-13 MED ORDER — FENTANYL CITRATE PF 50 MCG/ML IJ SOSY
100.0000 ug | PREFILLED_SYRINGE | Freq: Once | INTRAMUSCULAR | Status: AC
  Administered 2022-10-13: 100 ug via INTRAVENOUS
  Filled 2022-10-13: qty 2

## 2022-10-13 MED ORDER — LACTATED RINGERS IV SOLN: INTRAVENOUS | Status: DC

## 2022-10-13 MED ORDER — DEXAMETHASONE SODIUM PHOSPHATE 10 MG/ML IJ SOLN
INTRAMUSCULAR | Status: DC | PRN
  Administered 2022-10-13: 10 mg via INTRAVENOUS

## 2022-10-13 MED ORDER — OXYCODONE HCL 5 MG PO TABS: 5.0000 mg | ORAL_TABLET | Freq: Once | ORAL | Status: DC | PRN

## 2022-10-13 MED ORDER — SUCCINYLCHOLINE CHLORIDE 200 MG/10ML IV SOSY
PREFILLED_SYRINGE | INTRAVENOUS | Status: DC | PRN
  Administered 2022-10-13: 120 mg via INTRAVENOUS

## 2022-10-13 MED ORDER — MIDAZOLAM HCL 2 MG/2ML IJ SOLN
INTRAMUSCULAR | Status: AC
  Filled 2022-10-13: qty 2

## 2022-10-13 MED ORDER — ONDANSETRON HCL 4 MG PO TABS: 4.0000 mg | ORAL_TABLET | Freq: Four times a day (QID) | ORAL | Status: DC | PRN

## 2022-10-13 SURGICAL SUPPLY — 50 items
BAG COUNTER SPONGE SURGICOUNT (BAG) ×1 IMPLANT
BAG DECANTER FOR FLEXI CONT (MISCELLANEOUS) IMPLANT
BAR GLASS FIBER EXFX 11X150 (EXFIX) IMPLANT
BAR GLASS FIBER EXFX 11X350 (EXFIX) IMPLANT
BNDG ELASTIC 4INX 5YD STR LF (GAUZE/BANDAGES/DRESSINGS) IMPLANT
BNDG ELASTIC 4X5.8 VLCR STR LF (GAUZE/BANDAGES/DRESSINGS) ×1 IMPLANT
BNDG ELASTIC 6X10 VLCR STRL LF (GAUZE/BANDAGES/DRESSINGS) IMPLANT
BNDG ELASTIC 6X5.8 VLCR STR LF (GAUZE/BANDAGES/DRESSINGS) ×1 IMPLANT
CAP PROTECTIVE TRANSFX 4.5X5MM (EXFIX) IMPLANT
CLAMP BLUE BAR TO BAR (EXFIX) IMPLANT
CLAMP BLUE BAR TO PIN (EXFIX) IMPLANT
COVER SURGICAL LIGHT HANDLE (MISCELLANEOUS) ×1 IMPLANT
DRAPE HALF SHEET 40X57 (DRAPES) ×2 IMPLANT
DRAPE INCISE IOBAN 66X45 STRL (DRAPES) IMPLANT
DRAPE OEC MINIVIEW 54X84 (DRAPES) IMPLANT
DRAPE U-SHAPE 47X51 STRL (DRAPES) ×1 IMPLANT
DRSG ADAPTIC 3X8 NADH LF (GAUZE/BANDAGES/DRESSINGS) ×1 IMPLANT
DRSG DERMACEA 8X12 NADH (GAUZE/BANDAGES/DRESSINGS) IMPLANT
DRSG XEROFORM 1X8 (GAUZE/BANDAGES/DRESSINGS) IMPLANT
DURAPREP 26ML APPLICATOR (WOUND CARE) ×1 IMPLANT
ELECT REM PT RETURN 9FT ADLT (ELECTROSURGICAL) ×1
ELECTRODE REM PT RTRN 9FT ADLT (ELECTROSURGICAL) ×1 IMPLANT
GAUZE PAD ABD 8X10 STRL (GAUZE/BANDAGES/DRESSINGS) ×1 IMPLANT
GLOVE SURG ORTHO 9.0 STRL STRW (GLOVE) ×1 IMPLANT
GOWN STRL REUS W/ TWL XL LVL3 (GOWN DISPOSABLE) ×3 IMPLANT
GOWN STRL REUS W/TWL XL LVL3 (GOWN DISPOSABLE) ×3
KIT BASIN OR (CUSTOM PROCEDURE TRAY) ×1 IMPLANT
KIT TURNOVER KIT B (KITS) ×1 IMPLANT
MANIFOLD NEPTUNE II (INSTRUMENTS) ×1 IMPLANT
NS IRRIG 1000ML POUR BTL (IV SOLUTION) ×1 IMPLANT
PACK ORTHO EXTREMITY (CUSTOM PROCEDURE TRAY) ×1 IMPLANT
PAD ARMBOARD 7.5X6 YLW CONV (MISCELLANEOUS) ×2 IMPLANT
PAD CAST 4YDX4 CTTN HI CHSV (CAST SUPPLIES) ×1 IMPLANT
PADDING CAST COTTON 4X4 STRL (CAST SUPPLIES) ×1
PENCIL BUTTON HOLSTER BLD 10FT (ELECTRODE) ×1 IMPLANT
PIN 3MM (EXFIX) IMPLANT
PIN CLAMP 2BAR 75MM BLUE (EXFIX) IMPLANT
PIN HALF YELLOW 5X160X35 (EXFIX) IMPLANT
PIN TRANSFIXING 5.0 (EXFIX) IMPLANT
STOCKINETTE 6  STRL (DRAPES) ×1
STOCKINETTE 6 STRL (DRAPES) ×1 IMPLANT
SUCTION FRAZIER HANDLE 10FR (MISCELLANEOUS) ×1
SUCTION TUBE FRAZIER 10FR DISP (MISCELLANEOUS) ×1 IMPLANT
SUT ETHILON 3 0 FSLX (SUTURE) ×1 IMPLANT
SUT VIC AB 2-0 CTB1 (SUTURE) ×1 IMPLANT
TOWEL GREEN STERILE (TOWEL DISPOSABLE) ×1 IMPLANT
TOWEL GREEN STERILE FF (TOWEL DISPOSABLE) ×1 IMPLANT
TUBE CONNECTING 12X1/4 (SUCTIONS) ×1 IMPLANT
WATER STERILE IRR 1000ML POUR (IV SOLUTION) ×1 IMPLANT
YANKAUER SUCT BULB TIP NO VENT (SUCTIONS) IMPLANT

## 2022-10-13 NOTE — Significant Event (Signed)
Rapid Response Event Note   Reason for Call :  Difficulty breathing in CT   Initial Focused Assessment:  Called by CT staff to assess patient. Brought to CT by transport in wheelchair, where in route complained of chest pain 7/10 and began leaning over to right side moaning. Upon arrival to CT staff noticed pt "shaking all over" and RRT was called.  Upon arrival patient in wheelchair slightly leaning to right complaining of right side chest pain, worse with inspiration. VSS. Skin warm and dry. Lungs clear. Heart tones normal. RLE wrapped in splint/ace, swelling noted.   VS 138/77 (94) HR 89 RR  O2 99  Interventions:  O2 Garza-Salinas II 2L placed MD paged Orders for CT chest, CT head  EKG: NSR 1mg  IV dilaudid x1  Chest pain relieved after repositioning/IV dilaudid, RLE pain decreased after IV dilaudid.   Plan of Care:  Patient sent to OR (planned) for exfix RLE. Recommend cardiac monitoring.  Event Summary:  MD Notified: Serena Croissant MD Call Time: [phone] Arrival Time: 1730 End Time: [phone]  Truddie Crumble, RN

## 2022-10-13 NOTE — Anesthesia Procedure Notes (Signed)
Procedure Name: Intubation Date/Time: 10/13/2022 7:45 PM  Performed by: Edmonia Caprio, CRNAPre-anesthesia Checklist: Patient identified, Emergency Drugs available, Suction available, Patient being monitored and Timeout performed Patient Re-evaluated:Patient Re-evaluated prior to induction Oxygen Delivery Method: Circle system utilized Preoxygenation: Pre-oxygenation with 100% oxygen Induction Type: IV induction and Rapid sequence Laryngoscope Size: Miller and 2 Grade View: Grade II Tube type: Oral Tube size: 7.5 mm Number of attempts: 1 Airway Equipment and Method: Stylet Placement Confirmation: positive ETCO2, breath sounds checked- equal and bilateral and ETT inserted through vocal cords under direct vision Secured at: 22 cm Tube secured with: Tape Dental Injury: Teeth and Oropharynx as per pre-operative assessment

## 2022-10-13 NOTE — ED Notes (Signed)
Preparing for procedural sedation. Dr.Pickering, RT, Ortho techs, Katrina Nurse, adult at bedside. Suction, ETCO@ in place. [place] at bedside.

## 2022-10-13 NOTE — Anesthesia Preprocedure Evaluation (Addendum)
Anesthesia Evaluation  Patient identified by MRN, date of birth, ID band Patient awake    Reviewed: Allergy & Precautions, H&P , NPO status , Patient's Chart, lab work & pertinent test results  Airway Mallampati: II   Neck ROM: full    Dental  (+) Teeth Intact, Dental Advisory Given   Pulmonary Current Smoker and Patient abstained from smoking.   breath sounds clear to auscultation       Cardiovascular negative cardio ROS  Rhythm:regular Rate:Normal     Neuro/Psych Seizures -, Well Controlled,     GI/Hepatic   Endo/Other    Renal/GU      Musculoskeletal   Abdominal   Peds  Hematology   Anesthesia Other Findings   Reproductive/Obstetrics                             Anesthesia Physical Anesthesia Plan  ASA: 2  Anesthesia Plan: General   Post-op Pain Management:    Induction: Intravenous  PONV Risk Score and Plan: 1 and Ondansetron, Dexamethasone, Midazolam and Treatment may vary due to age or medical condition  Airway Management Planned: Oral ETT  Additional Equipment:   Intra-op Plan:   Post-operative Plan: Extubation in OR  Informed Consent: I have reviewed the patients History and Physical, chart, labs and discussed the procedure including the risks, benefits and alternatives for the proposed anesthesia with the patient or authorized representative who has indicated his/her understanding and acceptance.     Dental advisory given  Plan Discussed with: CRNA, Anesthesiologist and Surgeon  Anesthesia Plan Comments:        Anesthesia Quick Evaluation

## 2022-10-13 NOTE — ED Notes (Signed)
ED TO INPATIENT HANDOFF REPORT  ED Nurse Name and Phone #: [phone], Katrina, RN  S Name/Age/Gender Antonio Flores 42 y.o. male Room/Bed: 032C/032C  Code Status   Code Status: Full Code  Home/SNF/Other Home Patient oriented to: self, place, time, and situation Is this baseline? Yes   Triage Complete: Triage complete  Chief Complaint Closed fracture of right distal tibia [S82.301A]  Triage Note Pt was cutting a tree. Harnessed on. Tree fell and pt was still attached to the tree. Landed on his feet with obvious deformity to right ankle. EMS gave Fentanyl.    Allergies No Known Allergies  Level of Care/Admitting Diagnosis ED Disposition     ED Disposition  Admit   Condition  --   Comment  Hospital Area: [facility] [[id number]]  Level of Care: Med-Surg [16]  May admit patient to [facility] or [facility] if equivalent level of care is available:: No  Covid Evaluation: Asymptomatic - no recent exposure (last 10 days) testing not required  Diagnosis: Closed fracture of right distal tibia [161096]  Admitting Physician: [name] [[id number]]  Attending Physician: [name] [[id number]]  Bed request comments: 5N  Certification:: I certify this patient will need inpatient services for at least 2 midnights  Estimated Length of Stay: 4          B Medical/Surgery History History reviewed. No pertinent past medical history. History reviewed. No pertinent surgical history.   A IV Location/Drains/Wounds Patient Lines/Drains/Airways Status     Active Line/Drains/Airways     Name Placement date Placement time Site Days   Peripheral IV 10/13/22 18 G Left Forearm 10/13/22  1206  Forearm  less than 1            Intake/Output Last 24 hours No intake or output data in the 24 hours ending 10/13/22 1357  Labs/Imaging No results found for this or any previous visit (from the past 48 hour(s)). DG Ankle Complete Right  Result  Date: 10/13/2022 CLINICAL DATA:  Fall, pain and deformity EXAM: RIGHT ANKLE - COMPLETE 3+ VIEW COMPARISON:  10/15/2021 FINDINGS: Acute displaced comminuted fractures of the right distal tibial metaphysis and the distal fibula shaft. Diffuse soft tissue swelling. No associated ankle joint subluxation or dislocation. No significant arthropathy. IMPRESSION: Acute displaced comminuted fractures of the right distal tibia and fibula. Electronically Signed   By: Judie Petit.  Shick M.D.   On: 10/13/2022 13:14    Pending Labs Unresulted Labs (From admission, onward)     Start     Ordered   10/20/22 0500  Creatinine, serum  (enoxaparin (LOVENOX)    CrCl >/= 30 ml/min)  Weekly,   R     Comments: while on enoxaparin therapy    10/13/22 1339   10/13/22 1338  HIV Antibody (routine testing w rflx)  (HIV Antibody (Routine testing w reflex) panel)  Once,   R        10/13/22 1339            Vitals/Pain Today's Vitals   10/13/22 1340 10/13/22 1341 10/13/22 1342 10/13/22 1344  BP: (!) 154/100   (!) 163/136  Pulse: 73 76 82 91  Resp: 12 12 14 12   Temp:      TempSrc:      SpO2: 100% 100% 99% 100%  Weight:      Height:      PainSc:        Isolation Precautions No active isolations  Medications Medications  enoxaparin (LOVENOX) injection 40  mg (has no administration in time range)  methocarbamol (ROBAXIN) tablet 500 mg (has no administration in time range)    Or  methocarbamol (ROBAXIN) 500 mg in dextrose 5 % 50 mL IVPB (has no administration in time range)  ondansetron (ZOFRAN) tablet 4 mg (has no administration in time range)    Or  ondansetron (ZOFRAN) injection 4 mg (has no administration in time range)  oxyCODONE (Oxy IR/ROXICODONE) immediate release tablet 5-15 mg (has no administration in time range)  morphine (PF) 2 MG/ML injection 2 mg (has no administration in time range)  HYDROmorphone (DILAUDID) injection 1 mg (1 mg Intravenous Given 10/13/22 1234)  ondansetron (ZOFRAN) injection 4 mg (4 mg  Intravenous Given 10/13/22 1233)  HYDROmorphone (DILAUDID) injection 1 mg (1 mg Intravenous Given 10/13/22 1333)  etomidate (AMIDATE) injection 7 mg (7 mg Intravenous Given 10/13/22 1340)  0.9 %  sodium chloride infusion (500 mL/hr Intravenous New Bag/Given 10/13/22 1341)  etomidate (AMIDATE) injection (3 mg Intravenous Given 10/13/22 1343)    Mobility Walks      Focused Assessments    R Recommendations: See Admitting Provider Note  Report given to:   Additional Notes: Patient speaks primarily spanish, the wife has been translating but if she leaves he would prefer to have an interpreter. Patient is A&Ox4, last intake was breakfast this morning around 0900. NPO since then.

## 2022-10-13 NOTE — ED Provider Notes (Signed)
[place] EMERGENCY DEPARTMENT AT [facility] Provider Note   CSN: [id number] Arrival date & time: 10/13/22  1204     History  Chief Complaint  Patient presents with   Foot Injury    Antonio Flores is a 42 y.o. male.   Foot Injury Patient presents with right foot or ankle injury.  Reportedly was cutting a tree and fell onto his right ankle.  No other injury.  Does have abrasion to left knee.  Did not hit head.  Not on blood thinners.  Has not had lunch today.    History reviewed. No pertinent past medical history.  Home Medications Prior to Admission medications   Medication Sig Start Date End Date Taking? Authorizing Provider  HYDROcodone-acetaminophen (NORCO/VICODIN) 5-325 MG tablet Take 1 tablet by mouth every 6 (six) hours as needed. 07/30/15   Derwood Kaplan, MD  ibuprofen (ADVIL,MOTRIN) 600 MG tablet Take 1 tablet (600 mg total) by mouth every 6 (six) hours as needed. 07/30/15   Derwood Kaplan, MD  methocarbamol (ROBAXIN) 500 MG tablet Take 1 tablet (500 mg total) by mouth 2 (two) times daily. 07/30/15   Derwood Kaplan, MD  predniSONE (DELTASONE) 10 MG tablet Take 6 tablets (60 mg total) by mouth daily. 07/30/15   Derwood Kaplan, MD  traMADol (ULTRAM) 50 MG tablet Take 1 tablet (50 mg total) by mouth every 6 (six) hours as needed. 10/15/21   Henderly, Britni A, PA-C      Allergies    Patient has no known allergies.    Review of Systems   Review of Systems  Physical Exam Updated Vital Signs BP 122/75 (BP Location: Right Arm)   Pulse 72   Temp 99.4 F (37.4 C) (Oral)   Resp 17   Ht 5\' 7"  (1.702 m)   Wt 93 kg   SpO2 98%   BMI 32.11 kg/m  Physical Exam Vitals and nursing note reviewed.  HENT:     Head: Atraumatic.  Eyes:     Pupils: Pupils are equal, round, and reactive to light.  Cardiovascular:     Rate and Rhythm: Regular rhythm.  Chest:     Chest wall: No tenderness.  Abdominal:     Tenderness: There is no abdominal tenderness.   Musculoskeletal:        General: Tenderness present.     Cervical back: Neck supple.     Comments: Mild abrasion to left knee without underlying tenderness.  Right knee also nontender right foot not specifically tender.  However there is tenderness and swelling of right ankle with deformity left foot deviated laterally.  Some bruising medially on the ankle.  Pulse intact.  Skin:    General: Skin is warm.  Neurological:     Mental Status: He is alert and oriented to person, place, and time.     ED Results / Procedures / Treatments   Labs (all labs ordered are listed, but only abnormal results are displayed) Labs Reviewed  SURGICAL PCR SCREEN  HIV ANTIBODY (ROUTINE TESTING W REFLEX)    EKG None  Radiology DG Ankle Complete Right  Result Date: 10/13/2022 CLINICAL DATA:  Right tibia and fibula fracture EXAM: RIGHT ANKLE - COMPLETE 3+ VIEW COMPARISON:  10/13/2022 FINDINGS: Redemonstration of acute displaced comminuted right distal tibia and fibula shaft fractures. Tibia fracture involves the articular surface. Ankle joint alignment preserved. Talus and calcaneus intact. No other joint abnormality. Overlying casting material noted. IMPRESSION: Acute displaced comminuted right distal tibia and fibula shaft fractures. Electronically Signed  By: Osvaldo Shipper M.D.   On: 10/13/2022 14:22   DG Foot Complete Right  Result Date: 10/13/2022 CLINICAL DATA:  Right tibia fibula fracture EXAM: RIGHT FOOT COMPLETE - 3+ VIEW COMPARISON:  10/13/2022 FINDINGS: Overlying cast artifact noted. No acute displaced fracture of the right foot or malalignment. No joint abnormality. Acute comminuted displaced right distal tibia and fibular fractures again noted. Tibia fracture involves the articular surface. IMPRESSION: Acute distal right tibia and fibula fractures. Electronically Signed   By: Judie Petit.  Shick M.D.   On: 10/13/2022 14:21   DG Tibia/Fibula Right  Result Date: 10/13/2022 CLINICAL DATA:  Right tibia fibula  fracture EXAM: RIGHT TIBIA AND FIBULA - 2 VIEW COMPARISON:  10/13/2022 FINDINGS: Cast material about the right lower leg and ankle. Acute displaced comminuted fractures again noted of the right distal tibial metaphysis and the distal fibula shaft. Similar degree of displacement. Tibia fracture does involve the articular surface on the lateral view. Talus and calcaneus intact. IMPRESSION: Similar degree of displacement of the comminuted right distal tibia and fibula fractures. Electronically Signed   By: Judie Petit.  Shick M.D.   On: 10/13/2022 14:19   DG Ankle Complete Right  Result Date: 10/13/2022 CLINICAL DATA:  Fall, pain and deformity EXAM: RIGHT ANKLE - COMPLETE 3+ VIEW COMPARISON:  10/15/2021 FINDINGS: Acute displaced comminuted fractures of the right distal tibial metaphysis and the distal fibula shaft. Diffuse soft tissue swelling. No associated ankle joint subluxation or dislocation. No significant arthropathy. IMPRESSION: Acute displaced comminuted fractures of the right distal tibia and fibula. Electronically Signed   By: Judie Petit.  Shick M.D.   On: 10/13/2022 13:14    Procedures .Ortho Injury Treatment  Date/Time: 10/13/2022 1:40 PM  Performed by: Benjiman Core, MD Authorized by: Benjiman Core, MD   Consent:    Consent obtained:  Written   Consent given by:  Patient   Risks discussed:  Fracture and irreducible dislocation   Alternatives discussed:  No treatment Universal protocol:    Patient identity confirmed:  Verbally with patientInjury location: ankle Location details: left ankle Injury type: fracture Fracture type: tibial plafond Pre-procedure neurovascular assessment: neurovascularly intact Pre-procedure range of motion: reduced  Anesthesia: Local anesthesia used: no  Patient sedated: Yes. Refer to sedation procedure documentation for details of sedation. Manipulation performed: yes X-ray confirmed reduction: yes Immobilization: splint Splint type: short leg Splint Applied  by: ED Provider and Ortho Tech Supplies used: Ortho-Glass Post-procedure neurovascular assessment: post-procedure neurovascularly intact Post-procedure neurological function: normal   .Sedation  Date/Time: 10/13/2022 1:40 PM  Performed by: Benjiman Core, MD Authorized by: Benjiman Core, MD   Consent:    Consent obtained:  Verbal   Consent given by:  Patient   Risks discussed:  Prolonged hypoxia resulting in organ damage, allergic reaction, inadequate sedation, nausea, dysrhythmia, respiratory compromise necessitating ventilatory assistance and intubation and vomiting   Alternatives discussed:  Analgesia without sedation and anxiolysis Universal protocol:    Immediately prior to procedure, a time out was called: yes     Patient identity confirmed:  Arm band and verbally with patient Indications:    Procedure performed:  Fracture reduction   Procedure necessitating sedation performed by:  Physician performing sedation Pre-sedation assessment:    Time since last food or drink:  5   ASA classification: class 1 - normal, healthy patient     Mallampati score:  II - soft palate, uvula, fauces visible   Pre-sedation assessments completed and reviewed: airway patency   Immediate pre-procedure details:    Reassessment:  Patient reassessed immediately prior to procedure   Procedure details (see MAR for exact dosages):    Preoxygenation:  Nasal cannula   Sedation:  Etomidate   Intended level of sedation: deep   Analgesia:  Hydromorphone   Intra-procedure monitoring:  Cardiac monitor, blood pressure monitoring, continuous pulse oximetry, continuous capnometry and frequent LOC assessments   Intra-procedure events: none     Total Provider sedation time (minutes):  10 Post-procedure details:    Attendance: Constant attendance by certified staff until patient recovered     Recovery: Patient returned to pre-procedure baseline     Patient is stable for discharge or admission: no      Procedure completion:  Tolerated well, no immediate complications     Medications Ordered in ED Medications  enoxaparin (LOVENOX) injection 40 mg (has no administration in time range)  methocarbamol (ROBAXIN) tablet 500 mg (500 mg Oral Given 10/13/22 1506)    Or  methocarbamol (ROBAXIN) 500 mg in dextrose 5 % 50 mL IVPB ( Intravenous See Alternative 10/13/22 1506)  ondansetron (ZOFRAN) tablet 4 mg (has no administration in time range)    Or  ondansetron (ZOFRAN) injection 4 mg (has no administration in time range)  oxyCODONE (Oxy IR/ROXICODONE) immediate release tablet 5-15 mg (15 mg Oral Given 10/13/22 1506)  morphine (PF) 2 MG/ML injection 2 mg (2 mg Intravenous Given 10/13/22 1507)  HYDROmorphone (DILAUDID) injection 1 mg (1 mg Intravenous Given 10/13/22 1234)  ondansetron (ZOFRAN) injection 4 mg (4 mg Intravenous Given 10/13/22 1233)  HYDROmorphone (DILAUDID) injection 1 mg (1 mg Intravenous Given 10/13/22 1333)  etomidate (AMIDATE) injection 7 mg (7 mg Intravenous Given 10/13/22 1340)  0.9 %  sodium chloride infusion (0 mLs Intravenous Stopped 10/13/22 1441)  etomidate (AMIDATE) injection (3 mg Intravenous Given 10/13/22 1343)  fentaNYL (SUBLIMAZE) injection 100 mcg (100 mcg Intravenous Given 10/13/22 1441)    ED Course/ Medical Decision Making/ A&P                             Medical Decision Making Amount and/or Complexity of Data Reviewed Radiology: ordered.  Risk Prescription drug management. Decision regarding hospitalization.   Patient with fall and ankle injury.  Likely fracture dislocation.  Discussed with Earney Hamburg from Ortho since there was some irritation of the skin medially.  Does have pulse intact.  Pain medicine given.  Will get x-ray and likely require sedation with reduction. Patient ankle immobilized and some reduction of fracture displacement done under sedation.  Admit to orthopedic surgery.        Final Clinical Impression(s) / ED Diagnoses Final diagnoses:   Closed fracture of distal end of right tibia, unspecified fracture morphology, initial encounter    Rx / DC Orders ED Discharge Orders     None         Benjiman Core, MD 10/13/22 1528

## 2022-10-13 NOTE — Sedation Documentation (Signed)
Pt is now awake, alert and oriented x 4. Talking to wife. Cardiac monitoring in place.

## 2022-10-13 NOTE — Transfer of Care (Signed)
Immediate Anesthesia Transfer of Care Note  Patient: Antonio Flores  Procedure(s) Performed: EXTERNAL FIXATION ANKLE (Right: Ankle)  Patient Location: PACU  Anesthesia Type:General  Level of Consciousness: awake and alert   Airway & Oxygen Therapy: Patient Spontanous Breathing and Patient connected to nasal cannula oxygen  Post-op Assessment: Report given to RN and Post -op Vital signs reviewed and stable  Post vital signs: Reviewed and stable  Last Vitals:  Vitals Value Taken Time  BP 142/84 10/13/22 2045  Temp    Pulse 91 10/13/22 2051  Resp 16 10/13/22 2051  SpO2 97 % 10/13/22 2051  Vitals shown include unvalidated device data.  Last Pain:  Vitals:   10/13/22 1842  TempSrc: Axillary  PainSc:          Complications: No notable events documented.

## 2022-10-13 NOTE — H&P (Signed)
ORTHOPAEDIC CONSULTATION   Chief Complaint: Right ankle injury  HPI: Antonio Flores is a 42 y.o. male who presents with a right ankle pilon fracture after a tree: On him earlier while he was cutting down a tree.  Denies any other site of collision or injury.  Only endorsing right ankle pain that today's visit.  He is here with his wife.  He does have a history of smoking half pack a day of cigarettes.  He states per his wife that he does have a history of childhood seizures.  This has not been an issue for which she is requiring medication.  He is comfortable on my examination after medication  Past Medical History:  Diagnosis Date   Seizure (HCC)    Up until age 26 yrs old   History reviewed. No pertinent surgical history. Social History   Socioeconomic History   Marital status: Married    Spouse name: Not on file   Number of children: Not on file   Years of education: Not on file   Highest education level: Not on file  Occupational History   Not on file  Tobacco Use   Smoking status: Every Day    Packs/day: .5    Types: Cigarettes   Smokeless tobacco: Not on file  Vaping Use   Vaping Use: Never used  Substance and Sexual Activity   Alcohol use: Never   Drug use: Never   Sexual activity: Not on file  Other Topics Concern   Not on file  Social History Narrative   Not on file   Social Determinants of Health   Financial Resource Strain: Not on file  Food Insecurity: Not on file  Transportation Needs: Not on file  Physical Activity: Not on file  Stress: Not on file  Social Connections: Not on file   History reviewed. No pertinent family history. - negative except otherwise stated in the family history section No Known Allergies Prior to Admission medications   Medication Sig Start Date End Date Taking? Authorizing Provider  HYDROcodone-acetaminophen (NORCO/VICODIN) 5-325 MG tablet Take 1 tablet by mouth every 6 (six) hours as needed. Patient not  taking: Reported on 10/13/2022 07/30/15   Derwood Kaplan, MD  ibuprofen (ADVIL,MOTRIN) 600 MG tablet Take 1 tablet (600 mg total) by mouth every 6 (six) hours as needed. Patient not taking: Reported on 10/13/2022 07/30/15   Derwood Kaplan, MD  methocarbamol (ROBAXIN) 500 MG tablet Take 1 tablet (500 mg total) by mouth 2 (two) times daily. Patient not taking: Reported on 10/13/2022 07/30/15   Derwood Kaplan, MD  predniSONE (DELTASONE) 10 MG tablet Take 6 tablets (60 mg total) by mouth daily. Patient not taking: Reported on 10/13/2022 07/30/15   Derwood Kaplan, MD  traMADol (ULTRAM) 50 MG tablet Take 1 tablet (50 mg total) by mouth every 6 (six) hours as needed. Patient not taking: Reported on 10/13/2022 10/15/21   Henderly, Britni A, PA-C   CT HEAD WO CONTRAST ( )  Result Date: 10/13/2022 CLINICAL DATA:  Fall EXAM: CT HEAD WITHOUT CONTRAST TECHNIQUE: Contiguous axial images were obtained from the base of the skull through the vertex without intravenous contrast. RADIATION DOSE REDUCTION: This exam was performed according to the departmental dose-optimization program which includes automated exposure control, adjustment of the mA and/or kV according to patient size and/or use of iterative reconstruction technique. COMPARISON:  None Available. FINDINGS: Brain: No evidence of acute infarction, hemorrhage, hydrocephalus, extra-axial collection or mass lesion/mass effect. 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 Vascular: No hyperdense vessel or unexpected calcification. Skull: Mild soft tissue swelling the left frontal scalp. Evidence of an underlying calvarial fracture. Sinuses/Orbits: No middle ear or mastoid effusion. Paranasal sinuses are clear. Orbits are unremarkable. Other: None. IMPRESSION: 1. No acute intracranial abnormality. 2. Mild soft tissue swelling of the left frontal scalp without evidence of an underlying calvarial fracture. Electronically Signed   By: Lorenza Cambridge M.D.   On: 10/13/2022 18:40   DG Ankle Complete Right  Result Date: 10/13/2022 CLINICAL DATA:  Right tibia and fibula fracture EXAM: RIGHT ANKLE - COMPLETE 3+ VIEW COMPARISON:  10/13/2022 FINDINGS: Redemonstration of acute displaced comminuted right distal tibia and fibula shaft fractures. Tibia fracture involves the articular surface. Ankle joint alignment preserved. Talus and calcaneus intact. No other joint abnormality. Overlying casting material noted. IMPRESSION: Acute displaced comminuted right distal tibia and fibula shaft fractures. Electronically Signed   By: Judie Petit.  Shick M.D.   On: 10/13/2022 14:22   DG Foot Complete Right  Result Date: 10/13/2022 CLINICAL DATA:  Right tibia fibula fracture EXAM: RIGHT FOOT COMPLETE - 3+ VIEW COMPARISON:  10/13/2022 FINDINGS: Overlying cast artifact noted. No acute displaced fracture of the right foot or malalignment. No joint abnormality. Acute comminuted displaced right distal tibia and fibular fractures again noted. Tibia fracture involves the articular surface. IMPRESSION: Acute distal right tibia and fibula fractures. Electronically Signed   By: Judie Petit.  Shick M.D.   On: 10/13/2022 14:21   DG Tibia/Fibula Right  Result Date: 10/13/2022 CLINICAL DATA:  Right tibia fibula fracture EXAM: RIGHT TIBIA AND FIBULA - 2 VIEW COMPARISON:  10/13/2022 FINDINGS: Cast material about the right lower leg and ankle. Acute displaced comminuted fractures again noted of the right distal tibial metaphysis and the distal fibula shaft. Similar degree of displacement. Tibia fracture does involve the articular surface on the lateral view. Talus and calcaneus intact. IMPRESSION: Similar degree of displacement of the comminuted right distal tibia and fibula fractures. Electronically Signed   By: Judie Petit.  Shick M.D.   On: 10/13/2022 14:19   DG Ankle Complete Right  Result Date: 10/13/2022 CLINICAL DATA:  Fall, pain and deformity EXAM: RIGHT ANKLE - COMPLETE 3+ VIEW COMPARISON:  10/15/2021  FINDINGS: Acute displaced comminuted fractures of the right distal tibial metaphysis and the distal fibula shaft. Diffuse soft tissue swelling. No associated ankle joint subluxation or dislocation. No significant arthropathy. IMPRESSION: Acute displaced comminuted fractures of the right distal tibia and fibula. Electronically Signed   By: Judie Petit.  Shick M.D.   On: 10/13/2022 13:14     Positive ROS: All other systems have been reviewed and were otherwise negative with the exception of those mentioned in the HPI and as above.  Physical Exam: General: No acute distress Cardiovascular: No pedal edema Respiratory: No cyanosis, no use of accessory musculature GI: No organomegaly, abdomen is soft and non-tender Skin: No lesions in the area of chief complaint Neurologic: Sensation intact distally Psychiatric: Patient is at baseline mood and affect Lymphatic: No axillary or cervical lymphadenopathy  MUSCULOSKELETAL:  There is palpation about the right ankle with splint in place.  There is swelling about the ankle lower extremity 2+ dorsalis pedis pulse.  Toes are warm and well-perfused.  Compartments of the right lower leg are compressible.  He has minimal pain with passive stretch  Independent Imaging Review: 2 view right ankle, CT scanner ankle: Displaced intra-articular right distal tibia fracture as well as fibular fracture with comminution and shortening  Assessment: 42 year old male  with a right ankle distal tibia pilon fracture after a tree had fallen on his ankle.  On my examination today he does not have significant concern for compartment syndrome.  I do believe that given the significant shortening and swelling that he would benefit from external fixation to allow for soft tissue rest and stabilize his fracture.  At this time we will plan for this.  All limitations and restrictions were discussed.  We did discuss the need for definitive fixation pending soft tissue status  Plan: Plan for right  ankle external fixation   After a lengthy discussion of treatment options, including risks, benefits, alternatives, complications of surgical and nonsurgical conservative options, the patient elected surgical repair.   The patient  is aware of the material risks  and complications including, but not limited to injury to adjacent structures, neurovascular injury, infection, numbness, bleeding, implant failure, thermal burns, stiffness, persistent pain, failure to heal, disease transmission from allograft, need for further surgery, dislocation, anesthetic risks, blood clots, risks of death,and others. The probabilities of surgical success and failure discussed with patient given their particular co-morbidities.The time and nature of expected rehabilitation and recovery was discussed.The patient's questions were all answered preoperatively.  No barriers to understanding were noted. I explained the natural history of the disease process and Rx rationale.  I explained to the patient what I considered to be reasonable expectations given their personal situation.  The final treatment plan was arrived at through a shared patient decision making process model.   Thank you for the consult and the opportunity to see Mr. Derald Lorge, MD [facility] 6:43 PM

## 2022-10-13 NOTE — Consult Note (Signed)
Reason for Consult:Right distal tib/fib fx Referring Physician: Carmell Austria Time called: 1221 Time at bedside: 1229   Antonio Flores is an 42 y.o. male.  HPI: Antonio Flores fell about 10 feet from a tree that he was working on. He had immediate right ankle pain and could not bear weight. He was brought to the ED where x-rays showed a distal tib/fib fx and orthopedic surgery was consulted.  History reviewed. No pertinent past medical history.  History reviewed. No pertinent surgical history.  History reviewed. No pertinent family history.  Social History:  reports that he has been smoking cigarettes. He has been smoking an average of .5 packs per day. He does not have any smokeless tobacco history on file. He reports that he does not drink alcohol and does not use drugs.  Allergies: No Known Allergies  Medications: I have reviewed the patient's current medications.  No results found for this or any previous visit (from the past 48 hour(s)).  DG Ankle Complete Right  Result Date: 10/13/2022 CLINICAL DATA:  Fall, pain and deformity EXAM: RIGHT ANKLE - COMPLETE 3+ VIEW COMPARISON:  10/15/2021 FINDINGS: Acute displaced comminuted fractures of the right distal tibial metaphysis and the distal fibula shaft. Diffuse soft tissue swelling. No associated ankle joint subluxation or dislocation. No significant arthropathy. IMPRESSION: Acute displaced comminuted fractures of the right distal tibia and fibula. Electronically Signed   By: Judie Petit.  Shick M.D.   On: 10/13/2022 13:14    Review of Systems  HENT:  Negative for ear discharge, ear pain, hearing loss and tinnitus.   Eyes:  Negative for photophobia and pain.  Respiratory:  Negative for cough and shortness of breath.   Cardiovascular:  Negative for chest pain.  Gastrointestinal:  Negative for abdominal pain, nausea and vomiting.  Genitourinary:  Negative for dysuria, flank pain, frequency and urgency.  Musculoskeletal:  Positive for  arthralgias (Right ankle). Negative for back pain, myalgias and neck pain.  Neurological:  Negative for dizziness and headaches.  Hematological:  Does not bruise/bleed easily.  Psychiatric/Behavioral:  The patient is not nervous/anxious.    Blood pressure (!) 139/91, pulse 64, temperature 98.6 F (37 C), temperature source Oral, resp. rate 12, height 5\' 7"  (1.702 m), weight 93 kg, SpO2 99 %. Physical Exam Constitutional:      General: He is not in acute distress.    Appearance: He is well-developed. He is not diaphoretic.  HENT:     Head: Normocephalic and atraumatic.  Eyes:     General: No scleral icterus.       Right eye: No discharge.        Left eye: No discharge.     Conjunctiva/sclera: Conjunctivae normal.  Cardiovascular:     Rate and Rhythm: Normal rate and regular rhythm.  Pulmonary:     Effort: Pulmonary effort is normal. No respiratory distress.  Musculoskeletal:     Cervical back: Normal range of motion.     Comments: RLE No traumatic wounds or rash, generalized ecchymoses and edema about ankle  Mod TTP  No knee effusion  Knee stable to varus/ valgus and anterior/posterior stress  Sens DPN, SPN, TN intact  Motor EHL 5/5  DP 2+, No significant edema  Skin:    General: Skin is warm and dry.  Neurological:     Mental Status: He is alert.  Psychiatric:        Mood and Affect: Mood normal.        Behavior: Behavior normal.     Assessment/Plan:  Right distal tib/fib fx -- Plan CR and splint by EDP, then CT. Plan for ex fix tonight by Dr. Steward Drone.    Freeman Caldron, PA-C Orthopedic Surgery [phone] 10/13/2022, 1:25 PM

## 2022-10-13 NOTE — Op Note (Signed)
   Date of Surgery: 10/13/2022  INDICATIONS: Mr. Routh is a 42 y.o.-year-old male with a right distal tibia fracture requiring external fixation.  The risk and benefits of the procedure were discussed in detail and documented in the pre-operative evaluation.   PREOPERATIVE DIAGNOSIS: 1.  Right distal tibia-fibula intra-articular pilon fracture  POSTOPERATIVE DIAGNOSIS: Same.  PROCEDURE: 1.  Right multiplanar external fixator application right ankle  SURGEON: Benancio Deeds MD  ASSISTANT: Kerby Less, ATC  ANESTHESIA:  general  IV FLUIDS AND URINE: See anesthesia record.  ANTIBIOTICS: Ancef  ESTIMATED BLOOD LOSS: 10 mL.  IMPLANTS:  * No implants in log *  DRAINS: None  CULTURES: None  COMPLICATIONS: none  DESCRIPTION OF PROCEDURE:   The patient was notified in the preoperative holding area.  The correct site was marked according universal protocol.  He subsequently taken back to the operating room.  Ancef was given 1 hour prior to skin incision.  He was prepped and draped in usual sterile fashion.  This time final timeout was performed.  15 blade was used to incise through the skin over the tibial crest.  2 size 5 partially-threaded pins were placed in the tibia.  A size 6 calcaneal transfixion pin was then placed from medial to lateral again with 15 blade used to incise through the skin over the calcaneus.  At this time fluoroscopy was also used to place a medial cuneiform pin.  15 blade was used to incise through skin.  This was drilled under direct fluoroscopic visualization on AP and lateral.  At this time traction was held and a series of wires were then placed in an A-frame configuration and subsequently tightened down.  The fracture was found to be out to length.  The wounds were thoroughly irrigated and a compression dressing with Xeroform around the pin sites, cast padding, Ace wrap was applied.  Compartments remain soft throughout with good distal perfusion.   All counts were correct at the end of the case.  The patient was taken the PACU without complication   POSTOPERATIVE PLAN: He will be nonweightbearing on the right lower extremity.  We will begin physical therapy.  He will be iced and elevated.  Daily soft tissue checks will be performed in the orthopedic trauma team will be consulted for definitive fixation.  Benancio Deeds, MD 8:44 PM

## 2022-10-13 NOTE — Interval H&P Note (Signed)
History and Physical Interval Note:  10/13/2022 6:53 PM  Antonio Flores  has presented today for surgery, with the diagnosis of closed avulsion fracture of right ankle.  The various methods of treatment have been discussed with the patient and family. After consideration of risks, benefits and other options for treatment, the patient has consented to  Procedure(s): EXTERNAL FIXATION ANKLE (Right) as a surgical intervention.  The patient's history has been reviewed, patient examined, no change in status, stable for surgery.  I have reviewed the patient's chart and labs.  Questions were answered to the patient's satisfaction.     [name]

## 2022-10-13 NOTE — Progress Notes (Signed)
Video interpreter Alecia Lemming [phone]) utilized to orient patient to PACU and current situation.  VSS and all questions answered.  Will continue to monitor.

## 2022-10-13 NOTE — Plan of Care (Signed)

## 2022-10-13 NOTE — ED Triage Notes (Signed)
Pt was cutting a tree. Harnessed on. Tree fell and pt was still attached to the tree. Landed on his feet with obvious deformity to right ankle. EMS gave Fentanyl.

## 2022-10-13 NOTE — Brief Op Note (Signed)
   Brief Op Note  Date of Surgery: 10/13/2022  Preoperative Diagnosis: closed avulsion fracture of right ankle  Postoperative Diagnosis: same  Procedure: Procedure(s): EXTERNAL FIXATION ANKLE  Implants: * No implants in log *  Surgeons: Surgeon(s): [name], MD  Anesthesia: Choice    Estimated Blood Loss: See anesthesia record  Complications: None  Condition to PACU: Stable  Benancio Deeds, MD 10/13/2022 8:41 PM

## 2022-10-14 ENCOUNTER — Encounter (HOSPITAL_COMMUNITY): Payer: Self-pay | Admitting: Orthopaedic Surgery

## 2022-10-14 ENCOUNTER — Inpatient Hospital Stay (HOSPITAL_COMMUNITY): Payer: Managed Care, Other (non HMO)

## 2022-10-14 LAB — HIV ANTIBODY (ROUTINE TESTING W REFLEX): HIV Screen 4th Generation wRfx: NONREACTIVE

## 2022-10-14 NOTE — Progress Notes (Addendum)
Patient as well as family in room are concerned about blood around external fixator down on right heel pins. Bleeding is minimal,r -On call PA notified. CN assisted with education to pt and family,

## 2022-10-14 NOTE — Progress Notes (Signed)
Physical Therapy Evaluation Patient Details Name: Antonio Flores MRN: [id number] DOB: 11-13-1980 Today's Date: 10/14/2022  History of Present Illness  42 yo male with onset of fall from tree with the tree falling and landing on his feet.  Found to have complex injuries to R ankle with tib/fib fractures and now in External Fixators NWB.  NO PMHx of note.  Clinical Impression  Pt was seen for first visit to assess AD and pt basically chose RW over crutches due to his immediate feeling of support on the device, having used crutches in the past.  Has a level environment, home with wife and will be able to have good support of extended family and friends to help with any needs.  Has at least 2 weeks and then will have the surgery fixators reduced, and will maintain NWB until further instructions.  Follow acutely for goals as outlined below.  Translation of tx was done for entire session.     Recommendations for follow up therapy are one component of a multi-disciplinary discharge planning process, led by the attending physician.  Recommendations may be updated based on patient status, additional functional criteria and insurance authorization.  Follow Up Recommendations       Assistance Recommended at Discharge Intermittent Supervision/Assistance  Patient can return home with the following  A little help with walking and/or transfers;A little help with bathing/dressing/bathroom;Assistance with cooking/housework;Assist for transportation;Help with stairs or ramp for entrance    Equipment Recommendations Rolling walker (2 wheels);BSC/3in1  Recommendations for Other Services       Functional Status Assessment Patient has had a recent decline in their functional status and demonstrates the ability to make significant improvements in function in a reasonable and predictable amount of time.     Precautions / Restrictions Precautions Precautions: Fall Precaution Comments: external  fixators Restrictions Weight Bearing Restrictions: Yes RLE Weight Bearing: Non weight bearing      Mobility  Bed Mobility Overal bed mobility: Needs Assistance Bed Mobility: Supine to Sit     Supine to sit: Min assist     General bed mobility comments: min assist to avoid stressing the EF's    Transfers Overall transfer level: Needs assistance Equipment used: Rolling walker (2 wheels) Transfers: Sit to/from Stand Sit to Stand: Min assist           General transfer comment: good effort, required one reminder about NWB with immediate compliance    Ambulation/Gait Ambulation/Gait assistance: Min guard Gait Distance (Feet): 75 Feet Assistive device: Rolling walker (2 wheels)   Gait velocity: redu Gait velocity interpretation: <1.31 ft/sec, indicative of household ambulator Pre-gait activities: standing wb ck General Gait Details: hopping on LLE with good follow through, no complaints but minor pain on LLE with pain meds requested for end of session  Stairs            Wheelchair Mobility    Modified Rankin (Stroke Patients Only)       Balance Overall balance assessment: Needs assistance Sitting-balance support: Single extremity supported Sitting balance-Leahy Scale: Good       Standing balance-Leahy Scale: Poor Standing balance comment: requires walker to balance                             Pertinent Vitals/Pain Pain Assessment Pain Assessment: Faces Faces Pain Scale: Hurts little more Pain Location: R foot and ankle Pain Descriptors / Indicators: Guarding, Grimacing Pain Intervention(s): Limited activity within patient's tolerance, Monitored during session,  Premedicated before session, Repositioned    Home Living Family/patient expects to be discharged to:: Private residence Living Arrangements: Spouse/significant other Available Help at Discharge: Family Type of Home: House Home Access: Level entry       Home Layout: One  level Home Equipment: None      Prior Function Prior Level of Function : Independent/Modified Independent             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       Hand Dominance   Dominant Hand: Right    Extremity/Trunk Assessment   Upper Extremity Assessment Upper Extremity Assessment: Overall WFL for tasks assessed    Lower Extremity Assessment Lower Extremity Assessment: RLE deficits/detail RLE Deficits / Details: lower leg in EF's RLE Coordination: decreased gross motor    Cervical / Trunk Assessment Cervical / Trunk Assessment: Normal  Communication   Communication: Prefers language other than English (translation for entire session)  Cognition Arousal/Alertness: Awake/alert Behavior During Therapy: WFL for tasks assessed/performed Overall Cognitive Status: Within Functional Limits for tasks assessed                                          General Comments General comments (skin integrity, edema, etc.): Pt is up to walk with good follow through, esp as pt has been so physically active prior to this event    Exercises     Assessment/Plan    PT Assessment Patient needs continued PT services  PT Problem List Decreased strength;Decreased range of motion;Decreased activity tolerance;Decreased balance;Decreased mobility;Decreased knowledge of use of DME;Decreased skin integrity;Pain       PT Treatment Interventions DME instruction;Gait training;Functional mobility training;Therapeutic activities;Therapeutic exercise;Balance training;Neuromuscular re-education;Patient/family education    PT Goals (Current goals can be found in the Care Plan section)  Acute Rehab PT Goals Patient Stated Goal: to walk and get home PT Goal Formulation: With patient/family Time For Goal Achievement: 10/28/22 Potential to Achieve Goals: Good    Frequency Min 5X/week     Co-evaluation               AM-PAC PT "6 Clicks" Mobility   Outcome Measure Help needed turning from your back to your side while in a flat bed without using bedrails?: None Help needed moving from lying on your back to sitting on the side of a flat bed without using bedrails?: A Little Help needed moving to and from a bed to a chair (including a wheelchair)?: A Little Help needed standing up from a chair using your arms (e.g., wheelchair or bedside chair)?: A Little Help needed to walk in hospital room?: A Little Help needed climbing 3-5 steps with a railing? : A Lot 6 Click Score: 18    End of Session Equipment Utilized During Treatment: Gait belt Activity Tolerance: Patient tolerated treatment well;Patient limited by fatigue Patient left: in chair;with call bell/phone within reach;with family/visitor present Nurse Communication: Mobility status;Other (comment) (RW used) PT Visit Diagnosis: Other abnormalities of gait and mobility (R26.89)    Time: 5409-8119 PT Time Calculation (min) (ACUTE ONLY): 34 min   Charges:   PT Evaluation $PT Eval Moderate Complexity: 1 Mod PT Treatments $Gait Training: 8-22 mins       Ivar Drape 10/14/2022, 6:52 PM  Samul Dada, PT PhD Acute Rehab Dept. Number: [facility] [id number] and [facility] [phone]

## 2022-10-14 NOTE — Progress Notes (Signed)
The patient and patient's family expressed concern about the patient having some bleeding around entry point of the external fixator at the heel.  The bleeding is minimal and they were reassured of this.  The area was marked with sharpie for monitoring.  The patient's nurse was able to inform the attending physician about said concerns.

## 2022-10-14 NOTE — Progress Notes (Signed)
   Subjective:  Patient reports pain much better controlled after external fixation.  Voiding.  Overall doing well.  Pending PT today.  Swelling somewhat improved  Objective:   VITALS:   Vitals:   10/13/22 2135 10/13/22 2154 10/13/22 2154 10/14/22 0416  BP:  (!) 145/98 (!) 145/98 129/81  Pulse: 77 79 79 73  Resp: 13 19 19 17   Temp:  99.5 F (37.5 C) 99.5 F (37.5 C) 98 F (36.7 C)  TempSrc:  Oral Oral   SpO2: 92% 95% 97% 95%  Weight:      Height:       Right lower extremity dressing is clean dry intact.  There is some bladder on the pin sites.  Dressing is largely clean.  Fires EHL as well as lesser toe extensors and flexors.  Toes warm and well-perfused.  Compartments are all soft and compressible  Lab Results  Component Value Date   WBC 14.5 (H) 10/13/2022   HGB 14.4 10/13/2022   HCT 40.3 10/13/2022   MCV 86.3 10/13/2022   PLT 142 (L) 10/13/2022     Assessment/Plan:  1 Day Post-Op right ankle external fixation for pilon fracture.  He is pending definitive fixation.  I discussed his surgical case with Dr. Jena Gauss who will consult and plan for definitive fixation pending improvement of his soft tissues.  - Patient to work with PT to optimize mobilization safely - DVT ppx - SCDs, ambulation, Lovenox - Postoperative Abx: Ancef x 2 additional doses given - NWB operative extremity - Pain control - multimodal pain management, ATC acetaminophen in conjunction with as needed narcotic (oxycodone), although this should be minimized with other modalities  -Currently admitted pending definitive fixation based on timing of his soft tissue swelling -Plan for n.p.o. at midnight in case of possible OR 5/3   Lovette Merta 10/14/2022, 7:32 AM

## 2022-10-14 NOTE — TOC CAGE-AID Note (Signed)
Transition of Care [facility]) - CAGE-AID Screening   Patient Details  Name: Antonio Flores MRN: [id number] Date of Birth: 1981-05-30  [facility]) CM/SW Contact:    Erin Sons, LCSW Phone Number: 10/14/2022, 9:48 AM   Clinical Narrative:  CAGE-AID completed; score of 0  CAGE-AID Screening:    Have You Ever Felt You Ought to Cut Down on Your Drinking or Drug Use?: No Have People Annoyed You By Critizing Your Drinking Or Drug Use?: No Have You Felt Bad Or Guilty About Your Drinking Or Drug Use?: No Have You Ever Had a Drink or Used Drugs First Thing In The Morning to Steady Your Nerves or to Get Rid of a Hangover?: No CAGE-AID Score: 0  Substance Abuse Education Offered: No

## 2022-10-15 LAB — CBC
HCT: 34.5 % — ABNORMAL LOW (ref 39.0–52.0)
Hemoglobin: 12.3 g/dL — ABNORMAL LOW (ref 13.0–17.0)
MCH: 30.8 pg (ref 26.0–34.0)
MCHC: 35.7 g/dL (ref 30.0–36.0)
MCV: 86.3 fL (ref 80.0–100.0)
Platelets: 123 10*3/uL — ABNORMAL LOW (ref 150–400)
RBC: 4 MIL/uL — ABNORMAL LOW (ref 4.22–5.81)
RDW: 12.3 % (ref 11.5–15.5)
WBC: 9.3 10*3/uL (ref 4.0–10.5)
nRBC: 0 % (ref 0.0–0.2)

## 2022-10-15 MED ORDER — DOCUSATE SODIUM 100 MG PO CAPS
100.0000 mg | ORAL_CAPSULE | Freq: Two times a day (BID) | ORAL | Status: DC
  Administered 2022-10-15 – 2022-10-17 (×5): 100 mg via ORAL
  Filled 2022-10-15 (×5): qty 1

## 2022-10-15 MED ORDER — HYDROXYZINE HCL 10 MG PO TABS
10.0000 mg | ORAL_TABLET | Freq: Three times a day (TID) | ORAL | Status: DC | PRN
  Administered 2022-10-16: 10 mg via ORAL
  Filled 2022-10-15: qty 1

## 2022-10-15 NOTE — Progress Notes (Signed)
   Subjective:  Patient reports pain much better controlled after external fixation.  Voiding.  Overall doing well. Worked with PT. Tolerating diet, but little appetite.  Objective:   VITALS:   Vitals:   10/14/22 1321 10/14/22 1926 10/14/22 2200 10/15/22 0422  BP: 123/73 (!) 141/73  114/71  Pulse: 79 90  86  Resp: 17 20  19   Temp: 98 F (36.7 C) 99.2 F (37.3 C) 99.3 F (37.4 C) 98.3 F (36.8 C)  TempSrc: Oral Oral  Oral  SpO2: 97% 97%  97%  Weight:      Height:       Right lower extremity dressing is clean dry intact.  Dressing is largely clean.  Swelling improved but minimal wrinkles. Fires EHL as well as lesser toe extensors and flexors.  Toes warm and well-perfused.  Compartments are all soft and compressible  Lab Results  Component Value Date   WBC 9.3 10/15/2022   HGB 12.3 (L) 10/15/2022   HCT 34.5 (L) 10/15/2022   MCV 86.3 10/15/2022   PLT 123 (L) 10/15/2022     Assessment/Plan:  2 Days Post-Op right ankle external fixation for pilon fracture.  He is pending definitive fixation.  I discussed his surgical case with Dr. Jena Gauss who will consult and plan for definitive fixation pending improvement of his soft tissues.  - Patient to work with PT to optimize mobilization safely - DVT ppx - SCDs, ambulation, Lovenox - NWB operative extremity - Pain control - multimodal pain management, ATC acetaminophen in conjunction with as needed narcotic (oxycodone), although this should be minimized with other modalities  -Currently admitted pending definitive fixation based on timing of his soft tissue swelling -Plan for OR likely 5/6   Marshae Azam 10/15/2022, 7:17 AM

## 2022-10-15 NOTE — TOC Progression Note (Signed)
Transition of Care [facility]) - Progression Note    Patient Details  Name: Roderich Cockcroft MRN: [id number] Date of Birth: 1981-05-22  [facility]) CM/SW Contact  Lockie Pares, RN Phone Number: 10/15/2022, 10:46 AM  Clinical Narrative:     Morrie Sheldon called from Rosann Auerbach, Case manager phone number [phone] X [phone] She can assist in coordinating DC needs such as HH As well they have a company that does their DME # [phone] they can assist in DME and listing of companies that work with home health.  Patient will likely have surgery on 5/6 TOC will continue to follow for needs, recommendations, and transitions of care.       Expected Discharge Plan and Services                                               Social Determinants of Health (SDOH) Interventions SDOH Screenings   Tobacco Use: High Risk (10/14/2022)    Readmission Risk Interventions     No data to display

## 2022-10-15 NOTE — Consult Note (Signed)
Orthopaedic Trauma Service (OTS) Consult   Patient ID: Antonio Flores MRN: [id number] DOB/AGE: 44/29/1982 41 y.o.  Reason for Consult:Right pilon fracture Referring Physician: Dr Steve Bokshan, MD [facility]  HPI: Antonio Flores is an 41 y.o. male who is being seen in consultation at the request of Dr. Bokshan for evaluation of right pilon fracture.  Patient was cutting down a tree when it fell on him sustaining a right intra-articular distal tibia fracture.  He was taken for closed reduction and external fixation.  Due to the complexity of his injury Dr. Bokshan felt that this was outside the scope of practice and required treatment by an orthopedic traumatologist.  Patient was seen at bedside with his wife.  Currently comfortable but is having pain in his leg.  Denies any injury to his left lower extremity or bilateral upper extremities.  Past Medical History:  Diagnosis Date   Seizure (HCC)    Up until age 6 yrs old    Past Surgical History:  Procedure Laterality Date   EXTERNAL FIXATION LEG Right 10/13/2022   Procedure: EXTERNAL FIXATION ANKLE;  Surgeon: Bokshan, Steven, MD;  Location: [facility] OR;  Service: Orthopedics;  Laterality: Right;    History reviewed. No pertinent family history.  Social History:  reports that he has been smoking cigarettes. He has been smoking an average of .5 packs per day. He does not have any smokeless tobacco history on file. He reports that he does not drink alcohol and does not use drugs.  Allergies: No Known Allergies  Medications:  No current facility-administered medications on file prior to encounter.   Current Outpatient Medications on File Prior to Encounter  Medication Sig Dispense Refill   HYDROcodone-acetaminophen (NORCO/VICODIN) 5-325 MG tablet Take 1 tablet by mouth every 6 (six) hours as needed. (Patient not taking: Reported on 10/13/2022) 15 tablet 0   ibuprofen (ADVIL,MOTRIN) 600 MG tablet Take 1 tablet (600 mg total)  by mouth every 6 (six) hours as needed. (Patient not taking: Reported on 10/13/2022) 30 tablet 0   methocarbamol (ROBAXIN) 500 MG tablet Take 1 tablet (500 mg total) by mouth 2 (two) times daily. (Patient not taking: Reported on 10/13/2022) 20 tablet 0   predniSONE (DELTASONE) 10 MG tablet Take 6 tablets (60 mg total) by mouth daily. (Patient not taking: Reported on 10/13/2022) 30 tablet 0   traMADol (ULTRAM) 50 MG tablet Take 1 tablet (50 mg total) by mouth every 6 (six) hours as needed. (Patient not taking: Reported on 10/13/2022) 10 tablet 0     ROS: Constitutional: No fever or chills Vision: No changes in vision ENT: No difficulty swallowing CV: No chest pain Pulm: No SOB or wheezing GI: No nausea or vomiting GU: No urgency or inability to hold urine Skin: No poor wound healing Neurologic: No numbness or tingling Psychiatric: No depression or anxiety Heme: No bruising Allergic: No reaction to medications or food   Exam: Blood pressure 115/70, pulse 71, temperature 98.8 F (37.1 C), temperature source Oral, resp. rate 17, height 5' 7" (1.702 m), weight 93 kg, SpO2 96 %. General: No acute distress Orientation: Awake alert and oriented x 3 Mood and Affect: Cooperative and pleasant Gait: To assess due to fracture Coordination and balance: Within normal limits  Right lower extremity: Ex-Fix in place.  There is bloody drainage from the pin sites.  There is oozing of sanguinous areas on the side.  There is an Ace wrap that is in place.  Compartments are soft compressible.  He does have significant   swelling along the anterior aspect of his ankle.  He is able to wiggle his toes.  He endorses sensation of the dorsum and plantar aspect of his foot.  Left lower extremity: Skin without lesions. No tenderness to palpation. Full painless ROM, full strength in each muscle groups without evidence of instability.   Medical Decision Making: Data: Imaging: X-rays and CT scan are reviewed which shows a  intra-articular distal tibia/pilon fracture with associated fibular fracture.  Post Ex-Fix images show retained length and alignment.  Labs:  Results for orders placed or performed during the hospital encounter of 10/13/22 (from the past 24 hour(s))  CBC     Status: Abnormal   Collection Time: 10/15/22  5:04 AM  Result Value Ref Range   WBC 9.3 4.0 - 10.5 K/uL   RBC 4.00 (L) 4.22 - 5.81 MIL/uL   Hemoglobin 12.3 (L) 13.0 - 17.0 g/dL   HCT 34.5 (L) 39.0 - 52.0 %   MCV 86.3 80.0 - 100.0 fL   MCH 30.8 26.0 - 34.0 pg   MCHC 35.7 30.0 - 36.0 g/dL   RDW 12.3 11.5 - 15.5 %   Platelets 123 (L) 150 - 400 K/uL   nRBC 0.0 0.0 - 0.2 %     Imaging or Labs ordered: Right ankle x-rays  Medical history and chart was reviewed and case discussed with medical provider.  Assessment/Plan: 41-year-old male with a right pilon fracture status post closed reduction and external fixation.  I reviewed imaging and case with Dr. Bokshan who feels that this is outside the scope of practice.  Patient will require formal open reduction internal fixation of his right distal tibia.  I discussed risk and benefits with the patient and his wife.  Risks include but not limited to bleeding, infection, malunion, nonunion, hardware failure, hardware irritation, nerve or blood vessel injury, posttraumatic arthritis, DVT, ankle stiffness, even the possibility anesthetic complications.  Will tentatively plan for surgery Monday morning pending swelling.  I will plan to check him first thing in the morning on Monday.  N.p.o. after midnight on Sunday night.  Neko Mcgeehan P. Oniyah Rohe, MD [facility] [phone] (office) [email or web address]   

## 2022-10-15 NOTE — H&P (View-Only) (Signed)
Orthopaedic Trauma Service (OTS) Consult   Patient ID: Antonio Flores MRN: [id number] DOB/AGE: 10/12/1980 42 y.o.  Reason for Consult:Right pilon fracture Referring Physician: Dr Merton Border, MD Cyndia Skeeters  HPI: Antonio Flores is an 42 y.o. male who is being seen in consultation at the request of Dr. Steward Drone for evaluation of right pilon fracture.  Patient was cutting down a tree when it fell on him sustaining a right intra-articular distal tibia fracture.  He was taken for closed reduction and external fixation.  Due to the complexity of his injury Dr. Steward Drone felt that this was outside the scope of practice and required treatment by an orthopedic traumatologist.  Patient was seen at bedside with his wife.  Currently comfortable but is having pain in his leg.  Denies any injury to his left lower extremity or bilateral upper extremities.  Past Medical History:  Diagnosis Date   Seizure [facility])    Up until age 16 yrs old    Past Surgical History:  Procedure Laterality Date   EXTERNAL FIXATION LEG Right 10/13/2022   Procedure: EXTERNAL FIXATION ANKLE;  Surgeon: [name], MD;  Location: [facility] OR;  Service: Orthopedics;  Laterality: Right;    History reviewed. No pertinent family history.  Social History:  reports that he has been smoking cigarettes. He has been smoking an average of .5 packs per day. He does not have any smokeless tobacco history on file. He reports that he does not drink alcohol and does not use drugs.  Allergies: No Known Allergies  Medications:  No current facility-administered medications on file prior to encounter.   Current Outpatient Medications on File Prior to Encounter  Medication Sig Dispense Refill   HYDROcodone-acetaminophen (NORCO/VICODIN) 5-325 MG tablet Take 1 tablet by mouth every 6 (six) hours as needed. (Patient not taking: Reported on 10/13/2022) 15 tablet 0   ibuprofen (ADVIL,MOTRIN) 600 MG tablet Take 1 tablet (600 mg total)  by mouth every 6 (six) hours as needed. (Patient not taking: Reported on 10/13/2022) 30 tablet 0   methocarbamol (ROBAXIN) 500 MG tablet Take 1 tablet (500 mg total) by mouth 2 (two) times daily. (Patient not taking: Reported on 10/13/2022) 20 tablet 0   predniSONE (DELTASONE) 10 MG tablet Take 6 tablets (60 mg total) by mouth daily. (Patient not taking: Reported on 10/13/2022) 30 tablet 0   traMADol (ULTRAM) 50 MG tablet Take 1 tablet (50 mg total) by mouth every 6 (six) hours as needed. (Patient not taking: Reported on 10/13/2022) 10 tablet 0     ROS: Constitutional: No fever or chills Vision: No changes in vision ENT: No difficulty swallowing CV: No chest pain Pulm: No SOB or wheezing GI: No nausea or vomiting GU: No urgency or inability to hold urine Skin: No poor wound healing Neurologic: No numbness or tingling Psychiatric: No depression or anxiety Heme: No bruising Allergic: No reaction to medications or food   Exam: Blood pressure 115/70, pulse 71, temperature 98.8 F (37.1 C), temperature source Oral, resp. rate 17, height 5\' 7"  (1.702 m), weight 93 kg, SpO2 96 %. General: No acute distress Orientation: Awake alert and oriented x 3 Mood and Affect: Cooperative and pleasant Gait: To assess due to fracture Coordination and balance: Within normal limits  Right lower extremity: Ex-Fix in place.  There is bloody drainage from the pin sites.  There is oozing of sanguinous areas on the side.  There is an Ace wrap that is in place.  Compartments are soft compressible.  He does have significant  swelling along the anterior aspect of his ankle.  He is able to wiggle his toes.  He endorses sensation of the dorsum and plantar aspect of his foot.  Left lower extremity: Skin without lesions. No tenderness to palpation. Full painless ROM, full strength in each muscle groups without evidence of instability.   Medical Decision Making: Data: Imaging: X-rays and CT scan are reviewed which shows a  intra-articular distal tibia/pilon fracture with associated fibular fracture.  Post Ex-Fix images show retained length and alignment.  Labs:  Results for orders placed or performed during the hospital encounter of 10/13/22 (from the past 24 hour(s))  CBC     Status: Abnormal   Collection Time: 10/15/22  5:04 AM  Result Value Ref Range   WBC 9.3 4.0 - 10.5 K/uL   RBC 4.00 (L) 4.22 - 5.81 MIL/uL   Hemoglobin 12.3 (L) 13.0 - 17.0 g/dL   HCT 16.1 (L) 09.6 - 04.5 %   MCV 86.3 80.0 - 100.0 fL   MCH 30.8 26.0 - 34.0 pg   MCHC 35.7 30.0 - 36.0 g/dL   RDW 40.9 81.1 - 91.4 %   Platelets 123 (L) 150 - 400 K/uL   nRBC 0.0 0.0 - 0.2 %     Imaging or Labs ordered: Right ankle x-rays  Medical history and chart was reviewed and case discussed with medical provider.  Assessment/Plan: 42 year old male with a right pilon fracture status post closed reduction and external fixation.  I reviewed imaging and case with Dr. Steward Drone who feels that this is outside the scope of practice.  Patient will require formal open reduction internal fixation of his right distal tibia.  I discussed risk and benefits with the patient and his wife.  Risks include but not limited to bleeding, infection, malunion, nonunion, hardware failure, hardware irritation, nerve or blood vessel injury, posttraumatic arthritis, DVT, ankle stiffness, even the possibility anesthetic complications.  Will tentatively plan for surgery Monday morning pending swelling.  I will plan to check him first thing in the morning on Monday.  N.p.o. after midnight on Sunday night.  Roby Lofts, MD [facility] [phone] (office) [email or web address]

## 2022-10-15 NOTE — Progress Notes (Signed)
PT Cancellation Note  Patient Details Name: Antonio Flores MRN: [id number] DOB: 01-02-1981   Cancelled Treatment:    Reason Eval/Treat Not Completed: Other (comment).  Declined PT for bedrest, will agree to walk tomorrow per pt.   Ivar Drape 10/15/2022, 2:58 PM  Samul Dada, PT PhD Acute Rehab Dept. Number: [facility] [id number] and [facility] [phone]

## 2022-10-16 MED ORDER — POLYETHYLENE GLYCOL 3350 17 G PO PACK
17.0000 g | PACK | Freq: Every day | ORAL | Status: DC | PRN
  Administered 2022-10-16 – 2022-10-19 (×3): 17 g via ORAL
  Filled 2022-10-16 (×3): qty 1

## 2022-10-16 NOTE — Progress Notes (Addendum)
  Subjective: Patient is a 42 year old male who is POD 3 s/p right ankle external fixation for pilon fracture.  States pain is controlled.  No chest pain, shortness of breath, calf pain.  Using ice and elevation to help with the swelling.  Pending definitive fixation by Dr. Jena Gauss on Monday.   Objective: Vital signs in last 24 hours: Temp:  [98 F (36.7 C)-98.8 F (37.1 C)] 98 F (36.7 C) (05/04 0823) Pulse Rate:  [71-84] 79 (05/04 0823) Resp:  [14-18] 14 (05/04 0823) BP: (115-133)/(70-87) 129/82 (05/04 0823) SpO2:  [94 %-96 %] 94 % (05/04 0823)  Intake/Output from previous day: No intake/output data recorded. Intake/Output this shift: No intake/output data recorded.  Exam:  External fixator in place in the right lower extremity with no active bleeding or drainage from the pin sites.  There is no calf tenderness bilaterally.  Negative Homans' sign on the left.  Right foot warm well-perfused.  Labs: Recent Labs    10/13/22 1831 10/15/22 0504  HGB 14.4 12.3*   Recent Labs    10/13/22 1831 10/15/22 0504  WBC 14.5* 9.3  RBC 4.67 4.00*  HCT 40.3 34.5*  PLT 142* 123*   Recent Labs    10/13/22 1831  NA 136  K 3.3*  CL 104  CO2 24  BUN 12  CREATININE 0.84  GLUCOSE 97  CALCIUM 8.8*   No results for input(s): "LABPT", "INR" in the last 72 hours.  Assessment/Plan: Plan is continue with elevation and ice to help with swelling to facilitate surgical intervention on Monday by Dr. Jena Gauss.  Lovenox was administered this morning and this was discontinued in anticipation of his upcoming procedure on Monday.  N.p.o. after midnight on Sunday; order placed.   Luke Valori Hollenkamp 10/16/2022, 10:35 AM

## 2022-10-17 NOTE — Progress Notes (Signed)
  Subjective: Patient stable.  Pain controlled.  Foot elevated.   Objective: Vital signs in last 24 hours: Temp:  [97.6 F (36.4 C)-98.2 F (36.8 C)] 98.1 F (36.7 C) (05/05 0000) Pulse Rate:  [77-83] 78 (05/05 0000) Resp:  [14-17] 16 (05/05 0000) BP: (122-136)/(73-82) 122/76 (05/05 0000) SpO2:  [94 %-99 %] 97 % (05/05 0000)  Intake/Output from previous day: 05/04 0701 - 05/05 0700 In: 720 [P.O.:720] Out: 1000 [Urine:1000] Intake/Output this shift: No intake/output data recorded.  Exam:  Right foot toes mobile and perfused and sensate.  Compartments soft in the lower leg  Labs: Recent Labs    10/15/22 0504  HGB 12.3*   Recent Labs    10/15/22 0504  WBC 9.3  RBC 4.00*  HCT 34.5*  PLT 123*   No results for input(s): "NA", "K", "CL", "CO2", "BUN", "CREATININE", "GLUCOSE", "CALCIUM" in the last 72 hours. No results for input(s): "LABPT", "INR" in the last 72 hours.  Assessment/Plan: Plan for surgery with orthopedic trauma service tomorrow.  Overall I think the patient and his wife are doing a good job of keeping the leg elevated.   Marrianne Mood Hendrik Donath 10/17/2022, 8:48 AM

## 2022-10-18 ENCOUNTER — Encounter (HOSPITAL_COMMUNITY)
Admission: EM | Disposition: A | Discharge status: Home / Self Care | Payer: Self-pay | Source: Home / Self Care | Attending: Orthopaedic Surgery

## 2022-10-18 ENCOUNTER — Other Ambulatory Visit: Payer: Self-pay

## 2022-10-18 ENCOUNTER — Inpatient Hospital Stay (HOSPITAL_COMMUNITY): Payer: Managed Care, Other (non HMO) | Admitting: Anesthesiology

## 2022-10-18 ENCOUNTER — Inpatient Hospital Stay (HOSPITAL_COMMUNITY): Payer: Managed Care, Other (non HMO)

## 2022-10-18 ENCOUNTER — Encounter (HOSPITAL_COMMUNITY): Payer: Self-pay | Admitting: Orthopaedic Surgery

## 2022-10-18 DIAGNOSIS — S82871A Displaced pilon fracture of right tibia, initial encounter for closed fracture: Secondary | ICD-10-CM

## 2022-10-18 DIAGNOSIS — F17210 Nicotine dependence, cigarettes, uncomplicated: Secondary | ICD-10-CM

## 2022-10-18 HISTORY — PX: OPEN REDUCTION INTERNAL FIXATION (ORIF) TIBIA/FIBULA FRACTURE: SHX5992

## 2022-10-18 HISTORY — PX: EXTERNAL FIXATION REMOVAL: SHX5040

## 2022-10-18 LAB — CBC
HCT: 35.9 % — ABNORMAL LOW (ref 39.0–52.0)
Hemoglobin: 12.5 g/dL — ABNORMAL LOW (ref 13.0–17.0)
MCH: 30.5 pg (ref 26.0–34.0)
MCHC: 34.8 g/dL (ref 30.0–36.0)
MCV: 87.6 fL (ref 80.0–100.0)
Platelets: 165 10*3/uL (ref 150–400)
RBC: 4.1 MIL/uL — ABNORMAL LOW (ref 4.22–5.81)
RDW: 11.9 % (ref 11.5–15.5)
WBC: 10.7 10*3/uL — ABNORMAL HIGH (ref 4.0–10.5)
nRBC: 0 % (ref 0.0–0.2)

## 2022-10-18 LAB — CREATININE, SERUM
Creatinine, Ser: 0.92 mg/dL (ref 0.61–1.24)
GFR, Estimated: >60 mL/min (ref >60)

## 2022-10-18 SURGERY — OPEN REDUCTION INTERNAL FIXATION (ORIF) TIBIA/FIBULA FRACTURE
Anesthesia: General | Site: Ankle | Laterality: Right

## 2022-10-18 MED ORDER — FENTANYL CITRATE (PF) 250 MCG/5ML IJ SOLN
INTRAMUSCULAR | Status: DC | PRN
  Administered 2022-10-18 (×3): 50 ug via INTRAVENOUS
  Administered 2022-10-18 (×2): 25 ug via INTRAVENOUS

## 2022-10-18 MED ORDER — CHLORHEXIDINE GLUCONATE 0.12 % MT SOLN: 15.0000 mL | Freq: Once | OROMUCOSAL | Status: AC

## 2022-10-18 MED ORDER — MIDAZOLAM HCL 2 MG/2ML IJ SOLN: 1.0000 mg | Freq: Once | INTRAMUSCULAR | Status: AC

## 2022-10-18 MED ORDER — LIDOCAINE 2% (20 MG/ML) 5 ML SYRINGE
INTRAMUSCULAR | Status: DC | PRN
  Administered 2022-10-18: 100 mg via INTRAVENOUS

## 2022-10-18 MED ORDER — PROPOFOL 10 MG/ML IV BOLUS
INTRAVENOUS | Status: DC | PRN
  Administered 2022-10-18: 20 mg via INTRAVENOUS
  Administered 2022-10-18: 200 mg via INTRAVENOUS

## 2022-10-18 MED ORDER — FENTANYL CITRATE (PF) 100 MCG/2ML IJ SOLN
INTRAMUSCULAR | Status: AC
  Administered 2022-10-18: 50 ug via INTRAVENOUS
  Filled 2022-10-18: qty 2

## 2022-10-18 MED ORDER — KETOROLAC TROMETHAMINE 30 MG/ML IJ SOLN
30.0000 mg | Freq: Once | INTRAMUSCULAR | Status: AC | PRN
  Administered 2022-10-18: 30 mg via INTRAVENOUS

## 2022-10-18 MED ORDER — DEXAMETHASONE SODIUM PHOSPHATE 10 MG/ML IJ SOLN
INTRAMUSCULAR | Status: AC
  Filled 2022-10-18: qty 3

## 2022-10-18 MED ORDER — HYDROMORPHONE HCL 1 MG/ML IJ SOLN
0.2500 mg | INTRAMUSCULAR | Status: DC | PRN
  Administered 2022-10-18 (×4): 0.5 mg via INTRAVENOUS

## 2022-10-18 MED ORDER — HYDRALAZINE HCL 10 MG PO TABS: 10.0000 mg | ORAL_TABLET | Freq: Four times a day (QID) | ORAL | Status: DC | PRN

## 2022-10-18 MED ORDER — KETOROLAC TROMETHAMINE 15 MG/ML IJ SOLN
15.0000 mg | Freq: Four times a day (QID) | INTRAMUSCULAR | Status: DC
  Administered 2022-10-18 – 2022-10-19 (×3): 15 mg via INTRAVENOUS
  Filled 2022-10-18 (×3): qty 1

## 2022-10-18 MED ORDER — 0.9 % SODIUM CHLORIDE (POUR BTL) OPTIME
TOPICAL | Status: DC | PRN
  Administered 2022-10-18: 1000 mL

## 2022-10-18 MED ORDER — HYDROMORPHONE HCL 1 MG/ML IJ SOLN
INTRAMUSCULAR | Status: AC
  Filled 2022-10-18: qty 1

## 2022-10-18 MED ORDER — ROCURONIUM BROMIDE 10 MG/ML (PF) SYRINGE
PREFILLED_SYRINGE | INTRAVENOUS | Status: AC
  Filled 2022-10-18: qty 30

## 2022-10-18 MED ORDER — ROPIVACAINE HCL 5 MG/ML IJ SOLN
INTRAMUSCULAR | Status: DC | PRN
  Administered 2022-10-18: 30 mL via PERINEURAL

## 2022-10-18 MED ORDER — ENOXAPARIN SODIUM 40 MG/0.4ML IJ SOSY
40.0000 mg | PREFILLED_SYRINGE | INTRAMUSCULAR | Status: DC
  Administered 2022-10-19 – 2022-10-20 (×2): 40 mg via SUBCUTANEOUS
  Filled 2022-10-18 (×2): qty 0.4

## 2022-10-18 MED ORDER — DOCUSATE SODIUM 100 MG PO CAPS
100.0000 mg | ORAL_CAPSULE | Freq: Two times a day (BID) | ORAL | Status: DC
  Administered 2022-10-18 – 2022-10-20 (×5): 100 mg via ORAL
  Filled 2022-10-18 (×5): qty 1

## 2022-10-18 MED ORDER — MIDAZOLAM HCL 2 MG/2ML IJ SOLN
INTRAMUSCULAR | Status: AC
  Filled 2022-10-18: qty 2

## 2022-10-18 MED ORDER — CEFAZOLIN SODIUM-DEXTROSE 2-4 GM/100ML-% IV SOLN
INTRAVENOUS | Status: AC
  Filled 2022-10-18: qty 100

## 2022-10-18 MED ORDER — ONDANSETRON HCL 4 MG PO TABS: 4.0000 mg | ORAL_TABLET | Freq: Four times a day (QID) | ORAL | Status: DC | PRN

## 2022-10-18 MED ORDER — PROPOFOL 10 MG/ML IV BOLUS
INTRAVENOUS | Status: AC
  Filled 2022-10-18: qty 20

## 2022-10-18 MED ORDER — VANCOMYCIN HCL 1000 MG IV SOLR
INTRAVENOUS | Status: DC | PRN
  Administered 2022-10-18: 1000 mg via TOPICAL

## 2022-10-18 MED ORDER — OXYCODONE HCL 5 MG/5ML PO SOLN: 5.0000 mg | Freq: Once | ORAL | Status: DC | PRN

## 2022-10-18 MED ORDER — FENTANYL CITRATE (PF) 250 MCG/5ML IJ SOLN
INTRAMUSCULAR | Status: AC
  Filled 2022-10-18: qty 5

## 2022-10-18 MED ORDER — CEFAZOLIN SODIUM-DEXTROSE 2-4 GM/100ML-% IV SOLN
2.0000 g | Freq: Three times a day (TID) | INTRAVENOUS | Status: AC
  Administered 2022-10-18 – 2022-10-19 (×3): 2 g via INTRAVENOUS
  Filled 2022-10-18 (×3): qty 100

## 2022-10-18 MED ORDER — LIDOCAINE 2% (20 MG/ML) 5 ML SYRINGE
INTRAMUSCULAR | Status: AC
  Filled 2022-10-18: qty 15

## 2022-10-18 MED ORDER — AMISULPRIDE (ANTIEMETIC) 5 MG/2ML IV SOLN
INTRAVENOUS | Status: AC
  Filled 2022-10-18: qty 4

## 2022-10-18 MED ORDER — FENTANYL CITRATE (PF) 100 MCG/2ML IJ SOLN: 50.0000 ug | Freq: Once | INTRAMUSCULAR | Status: AC

## 2022-10-18 MED ORDER — CEFAZOLIN SODIUM-DEXTROSE 2-4 GM/100ML-% IV SOLN
2.0000 g | Freq: Once | INTRAVENOUS | Status: AC
  Administered 2022-10-18: 2 g via INTRAVENOUS

## 2022-10-18 MED ORDER — MIDAZOLAM HCL 2 MG/2ML IJ SOLN
INTRAMUSCULAR | Status: AC
  Administered 2022-10-18: 1 mg via INTRAVENOUS
  Filled 2022-10-18: qty 2

## 2022-10-18 MED ORDER — OXYCODONE HCL 5 MG PO TABS: 5.0000 mg | ORAL_TABLET | Freq: Once | ORAL | Status: DC | PRN

## 2022-10-18 MED ORDER — ONDANSETRON HCL 4 MG/2ML IJ SOLN: 4.0000 mg | Freq: Once | INTRAMUSCULAR | Status: DC | PRN

## 2022-10-18 MED ORDER — METOCLOPRAMIDE HCL 5 MG/ML IJ SOLN: 5.0000 mg | Freq: Three times a day (TID) | INTRAMUSCULAR | Status: DC | PRN

## 2022-10-18 MED ORDER — AMISULPRIDE (ANTIEMETIC) 5 MG/2ML IV SOLN
10.0000 mg | Freq: Once | INTRAVENOUS | Status: AC
  Administered 2022-10-18: 10 mg via INTRAVENOUS

## 2022-10-18 MED ORDER — CHLORHEXIDINE GLUCONATE 0.12 % MT SOLN
OROMUCOSAL | Status: AC
  Administered 2022-10-18: 15 mL via OROMUCOSAL
  Filled 2022-10-18: qty 15

## 2022-10-18 MED ORDER — LACTATED RINGERS IV SOLN: INTRAVENOUS | Status: DC

## 2022-10-18 MED ORDER — ORAL CARE MOUTH RINSE: 15.0000 mL | Freq: Once | OROMUCOSAL | Status: AC

## 2022-10-18 MED ORDER — METOCLOPRAMIDE HCL 5 MG PO TABS: 5.0000 mg | ORAL_TABLET | Freq: Three times a day (TID) | ORAL | Status: DC | PRN

## 2022-10-18 MED ORDER — ONDANSETRON HCL 4 MG/2ML IJ SOLN
INTRAMUSCULAR | Status: DC | PRN
  Administered 2022-10-18: 4 mg via INTRAVENOUS

## 2022-10-18 MED ORDER — ONDANSETRON HCL 4 MG/2ML IJ SOLN
INTRAMUSCULAR | Status: AC
  Filled 2022-10-18: qty 6

## 2022-10-18 MED ORDER — SODIUM CHLORIDE 0.9 % IV SOLN: INTRAVENOUS | Status: DC

## 2022-10-18 MED ORDER — ONDANSETRON HCL 4 MG/2ML IJ SOLN: 4.0000 mg | Freq: Four times a day (QID) | INTRAMUSCULAR | Status: DC | PRN

## 2022-10-18 MED ORDER — ACETAMINOPHEN 500 MG PO TABS
1000.0000 mg | ORAL_TABLET | Freq: Four times a day (QID) | ORAL | Status: DC
  Administered 2022-10-18 – 2022-10-20 (×9): 1000 mg via ORAL
  Filled 2022-10-18 (×9): qty 2

## 2022-10-18 MED ORDER — MORPHINE SULFATE (PF) 2 MG/ML IV SOLN
2.0000 mg | INTRAVENOUS | Status: DC | PRN
  Administered 2022-10-18 – 2022-10-20 (×4): 2 mg via INTRAVENOUS
  Filled 2022-10-18 (×4): qty 1

## 2022-10-18 MED ORDER — KETOROLAC TROMETHAMINE 30 MG/ML IJ SOLN
INTRAMUSCULAR | Status: AC
  Filled 2022-10-18: qty 1

## 2022-10-18 MED ORDER — DEXAMETHASONE SODIUM PHOSPHATE 10 MG/ML IJ SOLN
INTRAMUSCULAR | Status: DC | PRN
  Administered 2022-10-18: 10 mg via INTRAVENOUS

## 2022-10-18 SURGICAL SUPPLY — 78 items
BAG COUNTER SPONGE SURGICOUNT (BAG) ×2 IMPLANT
BANDAGE ESMARK 6X9 LF (GAUZE/BANDAGES/DRESSINGS) ×2 IMPLANT
BIT DRILL 2.0 (BIT) ×2
BIT DRILL 2.5X2.75 QC CALB (BIT) IMPLANT
BIT DRILL 2XNS DISP SS SM FRAG (BIT) IMPLANT
BIT DRILL CALIBRATED 2.7 (BIT) IMPLANT
BIT DRL 2XNS DISP SS SM FRAG (BIT) ×2
BNDG COHESIVE 4X5 TAN STRL (GAUZE/BANDAGES/DRESSINGS) ×2 IMPLANT
BNDG ELASTIC 4INX 5YD STR LF (GAUZE/BANDAGES/DRESSINGS) IMPLANT
BNDG ELASTIC 4X5.8 VLCR STR LF (GAUZE/BANDAGES/DRESSINGS) IMPLANT
BNDG ELASTIC 6INX 5YD STR LF (GAUZE/BANDAGES/DRESSINGS) IMPLANT
BNDG ELASTIC 6X5.8 VLCR STR LF (GAUZE/BANDAGES/DRESSINGS) IMPLANT
BNDG ESMARK 6X9 LF (GAUZE/BANDAGES/DRESSINGS) ×2
BRUSH SCRUB EZ PLAIN DRY (MISCELLANEOUS) ×4 IMPLANT
CHLORAPREP W/TINT 26 (MISCELLANEOUS) ×2 IMPLANT
COVER SURGICAL LIGHT HANDLE (MISCELLANEOUS) ×2 IMPLANT
CUFF TOURN SGL QUICK 34 (TOURNIQUET CUFF) ×2
CUFF TRNQT CYL 34X4X40X1 (TOURNIQUET CUFF) IMPLANT
DRAPE C-ARM 42X72 X-RAY (DRAPES) ×2 IMPLANT
DRAPE C-ARMOR (DRAPES) ×2 IMPLANT
DRAPE ORTHO SPLIT 77X108 STRL (DRAPES) ×4
DRAPE SURG ORHT 6 SPLT 77X108 (DRAPES) ×4 IMPLANT
DRAPE U-SHAPE 47X51 STRL (DRAPES) ×2 IMPLANT
DRSG MEPITEL 4X7.2 (GAUZE/BANDAGES/DRESSINGS) IMPLANT
ELECT REM PT RETURN 9FT ADLT (ELECTROSURGICAL) ×2
ELECTRODE REM PT RTRN 9FT ADLT (ELECTROSURGICAL) ×2 IMPLANT
GAUZE PAD ABD 8X10 STRL (GAUZE/BANDAGES/DRESSINGS) IMPLANT
GAUZE SPONGE 4X4 12PLY STRL (GAUZE/BANDAGES/DRESSINGS) IMPLANT
GLOVE BIO SURGEON STRL SZ 6.5 (GLOVE) ×6 IMPLANT
GLOVE BIO SURGEON STRL SZ7.5 (GLOVE) ×6 IMPLANT
GLOVE BIOGEL PI IND STRL 6.5 (GLOVE) ×2 IMPLANT
GLOVE BIOGEL PI IND STRL 7.5 (GLOVE) ×2 IMPLANT
GOWN STRL REUS W/ TWL LRG LVL3 (GOWN DISPOSABLE) ×4 IMPLANT
GOWN STRL REUS W/TWL LRG LVL3 (GOWN DISPOSABLE) ×4
K-WIRE ACE 1.6X6 (WIRE) ×2
KIT TURNOVER KIT B (KITS) ×2 IMPLANT
KWIRE ACE 1.6X6 (WIRE) IMPLANT
MANIFOLD NEPTUNE II (INSTRUMENTS) ×2 IMPLANT
NDL HYPO 21X1.5 SAFETY (NEEDLE) IMPLANT
NDL HYPO 25GX1X1/2 BEV (NEEDLE) ×2 IMPLANT
NEEDLE HYPO 21X1.5 SAFETY (NEEDLE) IMPLANT
NEEDLE HYPO 25GX1X1/2 BEV (NEEDLE) IMPLANT
NS IRRIG 1000ML POUR BTL (IV SOLUTION) ×2 IMPLANT
PACK TOTAL JOINT (CUSTOM PROCEDURE TRAY) ×2 IMPLANT
PAD ARMBOARD 7.5X6 YLW CONV (MISCELLANEOUS) ×4 IMPLANT
PAD CAST 4YDX4 CTTN HI CHSV (CAST SUPPLIES) IMPLANT
PADDING CAST ABS COTTON 4X4 ST (CAST SUPPLIES) IMPLANT
PADDING CAST ABS COTTON 6X4 NS (CAST SUPPLIES) IMPLANT
PADDING CAST COTTON 4X4 STRL (CAST SUPPLIES) ×2
PADDING CAST COTTON 6X4 STRL (CAST SUPPLIES) IMPLANT
PLATE 9H RT DIST ANTLAT TIB (Plate) ×2 IMPLANT
PLATE ANTLAT CNTR NAR 156X9 (Plate) IMPLANT
SCREW CORT FT 32X3.5XNONLOCK (Screw) IMPLANT
SCREW CORTICAL 2.7MM  42MM (Screw) ×2 IMPLANT
SCREW CORTICAL 2.7MM 42MM (Screw) IMPLANT
SCREW CORTICAL 3.5MM  32MM (Screw) ×4 IMPLANT
SCREW CORTICAL 3.5MM  34MM (Screw) ×4 IMPLANT
SCREW CORTICAL 3.5MM 34MM (Screw) IMPLANT
SCREW LOCK CORT STAR 3.5X40 (Screw) IMPLANT
SCREW LOCK CORT STAR 3.5X42 (Screw) IMPLANT
SCREW LOCK CORT STAR 3.5X46 (Screw) IMPLANT
SCREW T15 LP CORT 3.5X50MM NS (Screw) IMPLANT
SPLINT PLASTER CAST XFAST 5X30 (CAST SUPPLIES) IMPLANT
SPONGE T-LAP 18X18 ~~LOC~~+RFID (SPONGE) IMPLANT
STAPLER VISISTAT 35W (STAPLE) ×2 IMPLANT
SUCTION FRAZIER HANDLE 10FR (MISCELLANEOUS) ×2
SUCTION TUBE FRAZIER 10FR DISP (MISCELLANEOUS) ×2 IMPLANT
SUT ETHILON 3 0 PS 1 (SUTURE) ×4 IMPLANT
SUT PROLENE 0 CT (SUTURE) IMPLANT
SUT VIC AB 0 CT1 27 (SUTURE) ×2
SUT VIC AB 0 CT1 27XBRD ANBCTR (SUTURE) ×2 IMPLANT
SUT VIC AB 2-0 CT1 27 (SUTURE)
SUT VIC AB 2-0 CT1 TAPERPNT 27 (SUTURE) ×4 IMPLANT
SYR CONTROL 10ML LL (SYRINGE) ×2 IMPLANT
TOWEL GREEN STERILE (TOWEL DISPOSABLE) ×4 IMPLANT
TOWEL GREEN STERILE FF (TOWEL DISPOSABLE) ×2 IMPLANT
UNDERPAD 30X36 HEAVY ABSORB (UNDERPADS AND DIAPERS) ×2 IMPLANT
WATER STERILE IRR 1000ML POUR (IV SOLUTION) ×2 IMPLANT

## 2022-10-18 NOTE — Progress Notes (Signed)
PT Cancellation Note  Patient Details Name: Antonio Flores MRN: [id number] DOB: April 01, 1981   Cancelled Treatment:    Reason Eval/Treat Not Completed: Patient at procedure or test/unavailable.  Pt in surgery, retry tomorrow.   Ivar Drape 10/18/2022, 9:41 AM  Samul Dada, PT PhD Acute Rehab Dept. Number: [facility] [id number] and [facility] [phone]

## 2022-10-18 NOTE — Progress Notes (Signed)
Pt. Arrived to unit alert and oriented c/o pain of RLE, Wife at bedside. Bed in lowest position call bell within reach.

## 2022-10-18 NOTE — Anesthesia Procedure Notes (Signed)
Anesthesia Procedure Image    

## 2022-10-18 NOTE — Anesthesia Preprocedure Evaluation (Signed)
Anesthesia Evaluation  Patient identified by MRN, date of birth, ID band Patient awake    Reviewed: Allergy & Precautions, H&P , NPO status , Patient's Chart, lab work & pertinent test results  Airway Mallampati: II  TM Distance: >3 FB Neck ROM: Full    Dental no notable dental hx.    Pulmonary Current Smoker and Patient abstained from smoking.   Pulmonary exam normal breath sounds clear to auscultation       Cardiovascular negative cardio ROS Normal cardiovascular exam Rhythm:Regular Rate:Normal     Neuro/Psych negative neurological ROS  negative psych ROS   GI/Hepatic negative GI ROS, Neg liver ROS,,,  Endo/Other  negative endocrine ROS    Renal/GU negative Renal ROS  negative genitourinary   Musculoskeletal negative musculoskeletal ROS (+)    Abdominal   Peds negative pediatric ROS (+)  Hematology negative hematology ROS (+)   Anesthesia Other Findings   Reproductive/Obstetrics negative OB ROS                             Anesthesia Physical Anesthesia Plan  ASA: 2  Anesthesia Plan: General   Post-op Pain Management: Regional block*   Induction: Intravenous  PONV Risk Score and Plan: 1 and Ondansetron, Dexamethasone and Treatment may vary due to age or medical condition  Airway Management Planned: LMA  Additional Equipment:   Intra-op Plan:   Post-operative Plan: Extubation in OR  Informed Consent: I have reviewed the patients History and Physical, chart, labs and discussed the procedure including the risks, benefits and alternatives for the proposed anesthesia with the patient or authorized representative who has indicated his/her understanding and acceptance.     Dental advisory given  Plan Discussed with: CRNA and Surgeon  Anesthesia Plan Comments:        Anesthesia Quick Evaluation  

## 2022-10-18 NOTE — Transfer of Care (Signed)
Immediate Anesthesia Transfer of Care Note  Patient: Antonio Flores  Procedure(s) Performed: OPEN REDUCTION INTERNAL FIXATION (ORIF) PILON FRACTURE (Right: Ankle) REMOVAL EXTERNAL FIXATION LEG (Ankle)  Patient Location: PACU  Anesthesia Type:GA combined with regional for post-op pain  Level of Consciousness: awake  Airway & Oxygen Therapy: Patient Spontanous Breathing  Post-op Assessment: Report given to RN and Post -op Vital signs reviewed and stable  Post vital signs: Reviewed and stable  Last Vitals:  Vitals Value Taken Time  BP 144/92 10/18/22 1203  Temp    Pulse 97 10/18/22 1204  Resp 10 10/18/22 1204  SpO2 93 % 10/18/22 1204  Vitals shown include unvalidated device data.  Last Pain:  Vitals:   10/18/22 0900  TempSrc:   PainSc: 0-No pain      Patients Stated Pain Goal: 4 (10/18/22 0835)  Complications: No notable events documented.

## 2022-10-18 NOTE — Interval H&P Note (Signed)
History and Physical Interval Note:  10/18/2022 8:51 AM  Antonio Flores  has presented today for surgery, with the diagnosis of Right pilon fracture.  The various methods of treatment have been discussed with the patient and family. After consideration of risks, benefits and other options for treatment, the patient has consented to  Procedure(s): OPEN REDUCTION INTERNAL FIXATION (ORIF) PILON FRACTURE (Right) as a surgical intervention.  The patient's history has been reviewed, patient examined, no change in status, stable for surgery.  I have reviewed the patient's chart and labs.  Questions were answered to the patient's satisfaction.     Caryn Bee P Jonty Morrical

## 2022-10-18 NOTE — Anesthesia Procedure Notes (Signed)
Procedure Name: LMA Insertion Date/Time: 10/18/2022 10:01 AM  Performed by: Darryl Nestle, CRNAPre-anesthesia Checklist: Patient identified, Emergency Drugs available, Suction available and Patient being monitored Patient Re-evaluated:Patient Re-evaluated prior to induction Oxygen Delivery Method: Circle system utilized Preoxygenation: Pre-oxygenation with 100% oxygen Induction Type: IV induction Ventilation: Mask ventilation without difficulty LMA: LMA inserted LMA Size: 4.0 Tube type: Oral Number of attempts: 1 Placement Confirmation: breath sounds checked- equal and bilateral and positive ETCO2 Tube secured with: Tape Dental Injury: Teeth and Oropharynx as per pre-operative assessment

## 2022-10-18 NOTE — Progress Notes (Signed)
Pt. Off unit to OR.

## 2022-10-18 NOTE — Progress Notes (Signed)
   Subjective:  Doing well, no issues overnight. NPO for OR today  Objective:   VITALS:   Vitals:   10/17/22 0900 10/17/22 1417 10/17/22 2008 10/18/22 0344  BP: 119/71 118/77 120/70 121/79  Pulse: 82 77 79 79  Resp: 16 16 18 18   Temp: 98.2 F (36.8 C) 98.2 F (36.8 C) 98.1 F (36.7 C)   TempSrc: Oral Oral Oral   SpO2: 98% 97% 94% 98%  Weight:      Height:       Right lower extremity dressing is clean dry intact.  Dressing is largely clean.  Swelling improved with more wrinkles. Fires EHL as well as lesser toe extensors and flexors.  Toes warm and well-perfused.  Compartments are all soft and compressible  Lab Results  Component Value Date   WBC 9.3 10/15/2022   HGB 12.3 (L) 10/15/2022   HCT 34.5 (L) 10/15/2022   MCV 86.3 10/15/2022   PLT 123 (L) 10/15/2022     Assessment/Plan:  5 Days Post-Op right ankle external fixation for pilon fracture.  He is pending definitive fixation.  I discussed his surgical case with Dr. Jena Gauss who will plan for ORIF today  - DVT ppx - SCDs, ambulation, Lovenox held - NWB operative extremity - Pain control - multimodal pain management, ATC acetaminophen in conjunction with as needed narcotic (oxycodone), although this should be minimized with other modalities  -Currently admitted pending definitive fixation based on timing of his soft tissue swelling -Plan for OR today with Dr. Jerilee Hoh [facility] 10/18/2022, 6:55 AM

## 2022-10-18 NOTE — Op Note (Signed)
Orthopaedic Surgery Operative Note (CSN: [id number] ) Date of Surgery: 10/18/2022  Admit Date: 10/13/2022   Diagnoses: Pre-Op Diagnoses: Right closed pilon fracture  Post-Op Diagnosis: Same  Procedures: CPT 27828-Open reduction internal fixation of right pilon fracture CPT 20694-Removal of external fixation right leg  Surgeons : Primary: Roby Lofts, MD  Assistant: Ulyses Southward, PA-C  Location: OR 3   Anesthesia:General with regional block   Antibiotics: Ancef 2g preop with 1 gm vancomycin powder placed topically   Tourniquet time: 60 min at 250 mmHg  Estimated Blood Loss:200 mL  Complications:* No complications entered in OR log *   Specimens:* No specimens in log *   Implants: Implant Name Type Inv. Item Serial No. Manufacturer Lot No. LRB No. Used Action  PLATE 9H RT DIST ANTLAT TIB - [id number] Plate PLATE 9H RT DIST ANTLAT TIB  [employer]MER [employer](ORTH,TRAU,BIO,SG) ON TRAY Right 1 Implanted  SCREW CORTICAL 3.5MM  - [id number] Screw SCREW CORTICAL 3.5MM   [employer](ORTH,TRAU,BIO,SG) ON TRAY Right 2 Implanted  SCREW CORTICAL 3.5MM  - [id number] Screw SCREW CORTICAL 3.5MM   ZIMMER [employer]ON(ORTH,TRAU,BIO,SG) ON TRAY Right 2 Implanted  SCREW CORTICAL 2.7MM  - [id number] Screw SCREW CORTICAL 2.7MM   [employer](ORTH,TRAU,BIO,SG) ON TRAY Right 1 Implanted  SCREW LOCK CORT STAR 3.5X40 - [id number] Screw SCREW LOCK CORT STAR 3.5X40  ZIMMER [employer]ON(ORTH,TRAU,BIO,SG) ON TRAY Right 1 Implanted  SCREW LOCK CORT STAR 3.5X46 - [id number] Screw SCREW LOCK CORT STAR 3.5X46  ZIMMER [employer]ON(ORTH,TRAU,BIO,SG) ON TRAY Right 2 Implanted  SCREW LOCK CORT STAR 3.5X42 - [id number] Screw SCREW LOCK CORT STAR 3.5X42  ZIMMER [employer]ON(ORTH,TRAU,BIO,SG) ON TRAY Right 1 Implanted  SCREW T15 LP CORT 3.5X50MM NS - [id number] Screw SCREW T15 LP CORT 3.5X50MM NS  ZIMMER RECON(ORTH,TRAU,BIO,SG) ON TRAY Right 1 Implanted     Indications for Surgery: 42 year old male who sustained a  right closed pilon fracture that was taken urgently for closed reduction and external fixation by Dr. Steward Drone.  Due to the complexity of his injury he felt that this was outside the scope of practice and required treatment by an orthopedic traumatologist.  I recommended proceeding with open reduction internal fixation.  Risk and benefits were discussed with the patient and his significant other.  Risks included but not limited to bleeding, infection, malunion, nonunion, hardware failure, hardware irritation, nerve or blood vessel injury, DVT, posttraumatic arthritis, ankle stiffness, even the possibility anesthetic complications.  They agreed to proceed with surgery and consent was obtained.  Operative Findings: 1.  Open reduction internal fixation of right distal tibia/pilon fracture using [employer] ALPS anterior lateral distal tibial plate 2.  External fixation removal of left lower extremity.  Procedure: The patient was identified in the preoperative holding area. Consent was confirmed with the patient and their family and all questions were answered. The operative extremity was marked after confirmation with the patient. he was then brought back to the operating room by our anesthesia colleagues.  He was placed under general anesthetic and carefully transferred over to radiolucent flattop table.  A bump was placed under his operative hip.  A nonsterile tourniquet was placed to his upper thigh.  Right lower extremity was then prepped and draped in usual sterile fashion.  The external fixator was prepped into the field.  A timeout was then performed to verify the patient, the procedure, and the extremity.  Preoperative antibiotics were dosed.  Fluoroscopic imaging showed the unstable nature of his injury.  I loosened the external fixator.  There was some blistering medially that an incision.  However I felt that an anterior lateral incision would be most appropriate to access the fracture plane and  reduce the articular surface while staying away from the blistering over the medial aspect of his distal tibia.  A incision was made carried down through skin and subcutaneous tissue.  I took care to protect the superficial peroneal nerve.  I incised through the extensor retinaculum to mobilize the tendons of the extensor digitorum longus medially.  I then was able to carefully dissect under the neurovascular bundle and the remainder tendons to access the medial aspect of the distal tibia.  Once I had adequate exposure then proceeded to free one of the intercalary fragments from the anterior lateral aspect of the distal tibia which allowed a percutaneous reduction maneuver of the articular surface.  I confirmed with fluoroscopy that the joint was anatomically reduced.  I then placed a 2.7 millimeters screw from anterior to posterior to provisionally hold this reduction in place.  I then chose a 9 hole [employer] ALPS anterior lateral distal tibial locking plate and slid this submuscularly along the lateral cortex of the tibia.  I held it provisionally with K wires and confirmed positioning with fluoroscopy reduction maneuvers were operative formed to align the main metaphyseal fragments.  I then drilled and placed a nonlocking screw distally in the plate flush to bone.  I then percutaneously placed 3.5 millimeter screws into the tibial shaft while maintaining alignment of the metaphyseal region.  I then returned to the distal segment and proceeded to place the locking screws to complete the construct.  I felt that no further fixation was warranted for the fibula of the tibia.  Final fluoroscopic imaging was obtained.  The incision was copiously irrigated.  A gram of vancomycin powder was placed into the incision.  The extensor retinaculum was closed with 0 Vicryl suture.  The skin was closed with 2-0 Vicryl and 3-0 nylon.  The external fixator was then removed.  Sterile dressings were.  Well-padded short  leg splint was then.  The patient was then awoke from anesthesia and taken to the PACu in stable condition.  Post Op Plan/Instructions: The patient will be nonweightbearing to the right lower extremity.  He will receive postoperative Ancef.  He will receive Lovenox for DVT prophylaxis and discharged on aspirin 325 mg.  We will continue to have him mobilize with physical and Occupational Therapy.  I was present and performed the entire surgery.  Ulyses Southward, PA-C did assist me throughout the case. An assistant was necessary given the difficulty in approach, maintenance of reduction and ability to instrument the fracture.   Truitt Merle, MD [facility]

## 2022-10-18 NOTE — Anesthesia Postprocedure Evaluation (Signed)
Anesthesia Post Note  Patient: Antonio Flores  Procedure(s) Performed: EXTERNAL FIXATION ANKLE (Right: Ankle)     Patient location during evaluation: PACU Anesthesia Type: General Level of consciousness: awake and alert Pain management: pain level controlled Vital Signs Assessment: post-procedure vital signs reviewed and stable Respiratory status: spontaneous breathing, nonlabored ventilation, respiratory function stable and patient connected to nasal cannula oxygen Cardiovascular status: blood pressure returned to baseline and stable Postop Assessment: no apparent nausea or vomiting Anesthetic complications: no   No notable events documented.  Last Vitals:  Vitals:   10/17/22 2008 10/18/22 0344  BP: 120/70 121/79  Pulse: 79 79  Resp: 18 18  Temp: 36.7 C   SpO2: 94% 98%    Last Pain:  Vitals:   10/18/22 0453  TempSrc:   PainSc: Asleep                 Octavia Mottola S

## 2022-10-18 NOTE — Anesthesia Postprocedure Evaluation (Signed)
Anesthesia Post Note  Patient: Thermon Leyland Escalante-Villegas  Procedure(s) Performed: OPEN REDUCTION INTERNAL FIXATION (ORIF) PILON FRACTURE (Right: Ankle) REMOVAL EXTERNAL FIXATION LEG (Ankle)     Patient location during evaluation: PACU Anesthesia Type: General Level of consciousness: awake and alert Pain management: pain level controlled Vital Signs Assessment: post-procedure vital signs reviewed and stable Respiratory status: spontaneous breathing, nonlabored ventilation, respiratory function stable and patient connected to nasal cannula oxygen Cardiovascular status: blood pressure returned to baseline and stable Postop Assessment: no apparent nausea or vomiting Anesthetic complications: no  No notable events documented.  Last Vitals:  Vitals:   10/18/22 1203 10/18/22 1215  BP: (!) 144/92 (!) 160/100  Pulse: 99 97  Resp: 14 16  Temp: 37 C   SpO2: 95% 93%    Last Pain:  Vitals:   10/18/22 0900  TempSrc:   PainSc: 0-No pain                 Lolamae Voisin S

## 2022-10-18 NOTE — Anesthesia Procedure Notes (Signed)
Anesthesia Regional Block: Adductor canal block   Pre-Anesthetic Checklist: , timeout performed,  Correct Patient, Correct Site, Correct Laterality,  Correct Procedure, Correct Position, site marked,  Risks and benefits discussed,  Surgical consent,  Pre-op evaluation,  At surgeon's request and post-op pain management  Laterality: Right  Prep: chloraprep       Needles:  Injection technique: Single-shot  Needle Type: Echogenic Needle     Needle Length: 9cm      Additional Needles:   Procedures:,,,, ultrasound used (permanent image in chart),,    Narrative:  Start time: 10/18/2022 8:50 AM End time: 10/18/2022 8:57 AM Injection made incrementally with aspirations every 5 mL.  Performed by: Personally  Anesthesiologist: Eilene Ghazi, MD  Additional Notes: Patient tolerated the procedure well without complications

## 2022-10-19 LAB — BASIC METABOLIC PANEL
Anion gap: 6 (ref 5–15)
BUN: 18 mg/dL (ref 6–20)
CO2: 26 mmol/L (ref 22–32)
Calcium: 8.4 mg/dL — ABNORMAL LOW (ref 8.9–10.3)
Chloride: 100 mmol/L (ref 98–111)
Creatinine, Ser: 0.9 mg/dL (ref 0.61–1.24)
GFR, Estimated: >60 mL/min (ref >60)
Glucose, Bld: 113 mg/dL — ABNORMAL HIGH (ref 70–99)
Potassium: 4.1 mmol/L (ref 3.5–5.1)
Sodium: 132 mmol/L — ABNORMAL LOW (ref 135–145)

## 2022-10-19 LAB — CBC
HCT: 31.8 % — ABNORMAL LOW (ref 39.0–52.0)
Hemoglobin: 11.2 g/dL — ABNORMAL LOW (ref 13.0–17.0)
MCH: 30.2 pg (ref 26.0–34.0)
MCHC: 35.2 g/dL (ref 30.0–36.0)
MCV: 85.7 fL (ref 80.0–100.0)
Platelets: 159 10*3/uL (ref 150–400)
RBC: 3.71 MIL/uL — ABNORMAL LOW (ref 4.22–5.81)
RDW: 11.9 % (ref 11.5–15.5)
WBC: 12.7 10*3/uL — ABNORMAL HIGH (ref 4.0–10.5)
nRBC: 0 % (ref 0.0–0.2)

## 2022-10-19 LAB — VITAMIN D 25 HYDROXY (VIT D DEFICIENCY, FRACTURES): Vit D, 25-Hydroxy: 22.5 ng/mL — ABNORMAL LOW (ref 30–100)

## 2022-10-19 MED ORDER — KETOROLAC TROMETHAMINE 15 MG/ML IJ SOLN
15.0000 mg | Freq: Four times a day (QID) | INTRAMUSCULAR | Status: DC
  Administered 2022-10-19 – 2022-10-20 (×5): 15 mg via INTRAVENOUS
  Filled 2022-10-19 (×5): qty 1

## 2022-10-19 MED ORDER — VITAMIN D 25 MCG (1000 UNIT) PO TABS
2000.0000 [IU] | ORAL_TABLET | Freq: Every day | ORAL | Status: DC
  Administered 2022-10-19 – 2022-10-20 (×2): 2000 [IU] via ORAL
  Filled 2022-10-19 (×2): qty 2

## 2022-10-19 NOTE — Evaluation (Signed)
Occupational Therapy Evaluation Patient Details Name: Antonio Flores MRN: [id number] DOB: 03-19-1981 Today's Date: 10/19/2022   History of Present Illness 42 y.o. male with onset of fall from tree with the tree falling and landing on his feet.  Found to have complex injuries to Rt ankle with tib/fib fractures now s/p Ext Fixation for Rt distal tibia-fibula intra-articular pilon fx on 5/1 and now s/p ORIF with ex fix hardware removal on 5/6.  NO PMHx of note.   Clinical Impression   Pt is s/p above diagnosis. Pt motivated to improve function and return home. PLOF independent, worked as Building services engineer. Pt currently requires supervision for transfers, set up/supervision for ADLs, instructed on NWB precautions and sponge bathing by sink, safe use of RW. Pt displays good overall balance and strength to perform activities safely while maintaining precautions. Pt requires RW prior to DC. No continued OT or OT follow up necessary.     Recommendations for follow up therapy are one component of a multi-disciplinary discharge planning process, led by the attending physician.  Recommendations may be updated based on patient status, additional functional criteria and insurance authorization.   Assistance Recommended at Discharge Set up Supervision/Assistance  Patient can return home with the following A little help with walking and/or transfers;A little help with bathing/dressing/bathroom;Assistance with cooking/housework;Assist for transportation;Help with stairs or ramp for entrance    Functional Status Assessment  Patient has had a recent decline in their functional status and demonstrates the ability to make significant improvements in function in a reasonable and predictable amount of time.  Equipment Recommendations  Other (comment) (RW,)    Recommendations for Other Services       Precautions / Restrictions Precautions Precautions: Fall Restrictions Weight Bearing  Restrictions: Yes RLE Weight Bearing: Non weight bearing      Mobility Bed Mobility Overal bed mobility: Modified Independent                  Transfers Overall transfer level: Needs assistance Equipment used: Rolling walker (2 wheels) Transfers: Sit to/from Stand Sit to Stand: Min guard           General transfer comment: Pt able to maintain NWB precautions on RLE during mobility      Balance Overall balance assessment: Needs assistance Sitting-balance support: Feet supported, No upper extremity supported Sitting balance-Leahy Scale: Good Sitting balance - Comments: able to perofrm ADLs on EOB   Standing balance support: Bilateral upper extremity supported, During functional activity, Reliant on assistive device for balance Standing balance-Leahy Scale: Fair Standing balance comment: able to perform ADLs at sink unsupported, no LOB                           ADL either performed or assessed with clinical judgement   ADL Overall ADL's : Needs assistance/impaired Eating/Feeding: Independent   Grooming: Supervision/safety;Standing   Upper Body Bathing: Sitting;Supervision/ safety   Lower Body Bathing: Sitting/lateral leans;Supervison/ safety   Upper Body Dressing : Set up   Lower Body Dressing: Sit to/from stand;Supervision/safety   Toilet Transfer: Supervision/safety   Toileting- Clothing Manipulation and Hygiene: Supervision/safety       Functional mobility during ADLs: Supervision/safety General ADL Comments: Pt supervision/set up for ADLs, displays good safety awareness and maintains NWB precautions     Vision Baseline Vision/History: 0 No visual deficits Ability to See in Adequate Light: 0 Adequate Patient Visual Report: No change from baseline       Perception  Praxis      Pertinent Vitals/Pain Pain Assessment Pain Assessment: 0-10 Pain Score: 5  Faces Pain Scale: Hurts little more Pain Location: R foot and ankle Pain  Descriptors / Indicators: Grimacing, Sore, Throbbing Pain Intervention(s): Monitored during session     Hand Dominance Right   Extremity/Trunk Assessment Upper Extremity Assessment Upper Extremity Assessment: Overall WFL for tasks assessed   Lower Extremity Assessment Lower Extremity Assessment: Overall WFL for tasks assessed;RLE deficits/detail RLE Deficits / Details: limited due to splint and NWB RLE: Unable to fully assess due to immobilization   Cervical / Trunk Assessment Cervical / Trunk Assessment: Normal   Communication Communication Communication: No difficulties   Cognition Arousal/Alertness: Awake/alert Behavior During Therapy: WFL for tasks assessed/performed Overall Cognitive Status: Within Functional Limits for tasks assessed                                       General Comments       Exercises     Shoulder Instructions      Home Living Family/patient expects to be discharged to:: Private residence Living Arrangements: Spouse/significant other Available Help at Discharge: Family Type of Home: House Home Access: Level entry     Home Layout: One level     Bathroom Shower/Tub: Chief Strategy Officer: Standard Bathroom Accessibility: Yes How Accessible: Accessible via walker Home Equipment: None   Additional Comments: Pt would benefit from RW. 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 ADLs Comments: ind        OT Problem List: Decreased activity tolerance;Impaired balance (sitting and/or standing);Pain      OT Treatment/Interventions:      OT Goals(Current goals can be found in the care plan section) Acute Rehab OT Goals Patient Stated Goal: to return to PLOF, maybe find a different  job OT Goal Formulation: With patient Time For Goal Achievement: 11/02/22 Potential to Achieve Goals: Good  OT Frequency:      Co-evaluation              AM-PAC OT "6 Clicks" Daily Activity     Outcome Measure Help from another person eating meals?: None Help from another person taking care of personal grooming?: A Little Help from another person toileting, which includes using toliet, bedpan, or urinal?: A Little Help from another person bathing (including washing, rinsing, drying)?: A Little Help from another person to put on and taking off regular upper body clothing?: A Little Help from another person to put on and taking off regular lower body clothing?: A Little 6 Click Score: 19   End of Session Equipment Utilized During Treatment: Gait belt;Rolling walker (2 wheels) Nurse Communication: Mobility status  Activity Tolerance: Patient tolerated treatment well Patient left: in bed;with call bell/phone within reach;with bed alarm set  OT Visit Diagnosis: Other abnormalities of gait and mobility (R26.89);History of falling (Z91.81);Pain Pain - Right/Left: Right Pain - part of body: Leg                Time: 6578-4696 OT Time Calculation (min): 24 min Charges:  OT General Charges $OT Visit: 1 Visit OT Evaluation $  OT Eval Low Complexity: 1 Low OT Treatments $Self Care/Home Management : 8-22 mins  [address], OTR/L   Alexis Goodell 10/19/2022, 4:09 PM

## 2022-10-19 NOTE — Progress Notes (Signed)
Orthopaedic Trauma Progress Note  SUBJECTIVE: Patient doing okay this morning.  Notes moderate pain in the right lower extremity.  Pain medication helping some to ease the pain off.  Denies any significant numbness or tingling throughout the right lower extremity.  Has not been up out of bed yet since surgery.  No chest pain. No SOB. No nausea/vomiting. No other complaints.  Patient interested in getting a knee scooter for mobility.  Wife at bedside.  OBJECTIVE:  Vitals:   10/19/22 0420 10/19/22 0736  BP: (!) 162/97 100/64  Pulse: 87 73  Resp: 17 18  Temp: 98 F (36.7 C) 98.1 F (36.7 C)  SpO2: 99% 98%    General: Resting comfortably in bed, no acute distress Respiratory: No increased work of breathing.  Right lower extremity: Well-padded, well-fitting splint in place.  Nontender above the splint.  Able to wiggle toes.  Endorses sensation to light touch over all toes.  Toes warm and well-perfused.  Remainder of motor and sensory exam to the foot and ankle limited secondary to splint placement.  IMAGING: Stable post op imaging.   LABS:  Results for orders placed or performed during the hospital encounter of 10/13/22 (from the past 24 hour(s))  CBC     Status: Abnormal   Collection Time: 10/18/22  3:04 PM  Result Value Ref Range   WBC 10.7 (H) 4.0 - 10.5 K/uL   RBC 4.10 (L) 4.22 - 5.81 MIL/uL   Hemoglobin 12.5 (L) 13.0 - 17.0 g/dL   HCT 11.9 (L) 14.7 - 82.9 %   MCV 87.6 80.0 - 100.0 fL   MCH 30.5 26.0 - 34.0 pg   MCHC 34.8 30.0 - 36.0 g/dL   RDW 56.2 13.0 - 86.5 %   Platelets 165 150 - 400 K/uL   nRBC 0.0 0.0 - 0.2 %  Creatinine, serum     Status: None   Collection Time: 10/18/22  3:04 PM  Result Value Ref Range   Creatinine, Ser 0.92 0.61 - 1.24 mg/dL   GFR, Estimated >78 >46 mL/min  CBC     Status: Abnormal   Collection Time: 10/19/22  2:50 AM  Result Value Ref Range   WBC 12.7 (H) 4.0 - 10.5 K/uL   RBC 3.71 (L) 4.22 - 5.81 MIL/uL   Hemoglobin 11.2 (L) 13.0 - 17.0 g/dL    HCT 96.2 (L) 95.2 - 52.0 %   MCV 85.7 80.0 - 100.0 fL   MCH 30.2 26.0 - 34.0 pg   MCHC 35.2 30.0 - 36.0 g/dL   RDW 84.1 32.4 - 40.1 %   Platelets 159 150 - 400 K/uL   nRBC 0.0 0.0 - 0.2 %  Basic metabolic panel     Status: Abnormal   Collection Time: 10/19/22  2:50 AM  Result Value Ref Range   Sodium 132 (L) 135 - 145 mmol/L   Potassium 4.1 3.5 - 5.1 mmol/L   Chloride 100 98 - 111 mmol/L   CO2 26 22 - 32 mmol/L   Glucose, Bld 113 (H) 70 - 99 mg/dL   BUN 18 6 - 20 mg/dL   Creatinine, Ser 0.27 0.61 - 1.24 mg/dL   Calcium 8.4 (L) 8.9 - 10.3 mg/dL   GFR, Estimated >25 >36 mL/min   Anion gap 6 5 - 15    ASSESSMENT: Antonio Flores is a 42 y.o. male, 1 Day Post-Op s/p ORIF RIGHT PILON FRACTURE REMOVAL EXTERNAL FIXATION RIGHT LEG  CV/Blood loss: Acute blood loss anemia, Hgb 11.2 this morning.  Hemodynamically stable  PLAN: Weightbearing: NWB RLE ROM:  Ok for knee ROM as tolerated. Wiggle toes frequently  Incisional and dressing care: Dressings left intact until follow-up  Showering:  Ok to shower, keep splint covered and dry Orthopedic device(s): Splint RLE Pain management:  1. Tylenol 1000 mg q 6 hours scheduled 2. Robaxin 500 mg q 6 hours PRN 3. Oxycodone 10-20 mg q 4 hours PRN 4. Morphine 2 mg q 2 hours PRN 5. Toradol 15 mg q 6 hours x 5 days VTE prophylaxis: Lovenox, SCDs ID:  Ancef 2gm post op Foley/Lines:  No foley, KVO IVFs Impediments to Fracture Healing: Vit D level 22, started on supplementation today Dispo: PT/OT evaluation today.  Continue to work on pain control.  Plan to discharge home tomorrow if mobilizing well with therapy and pain remains well-controlled.  Patient is wife agree with plan.    D/C recommendations: -Oxycodone and Robaxin for pain control -Aspirin 325 mg daily x 30 days for DVT prophylaxis -Continue 2000 units daily Vit D supplementation  Follow - up plan: 2 weeks after discharge for wound check, splint and suture removal, and  repeat x-rays   Contact information:  Truitt Merle MD, Thyra Breed PA-C. After hours and holidays please check [email or web address] for group call information for [facility]   Thompson Caul, PA-C [phone] (office) [email or web address]

## 2022-10-19 NOTE — Progress Notes (Signed)
Physical Therapy Treatment Patient Details Name: Antonio Flores MRN: [id number] DOB: 07/26/1980 Today's Date: 10/19/2022   History of Present Illness 42 y.o. male with onset of fall from tree with the tree falling and landing on his feet.  Found to have complex injuries to Rt ankle with tib/fib fractures now s/p Ext Fixation for Rt distal tibia-fibula intra-articular pilon fx on 5/1 and now s/p ORIF with ex fix hardware removal on 5/6.  NO PMHx of note.    PT Comments    Patient resting in bed at start of session, pre-medicated for therapy. Pt no s/p ORIF with ext-fix removal and remains NWB on Rt LE. Pt able to complete bed mobility with supervision and demonstrates good awareness for NWB on Rt LE with sit<>stand using RW, min guard only. Pt ambulated ~40' with RW, no LOB and maintained precautions. Pt c/o dizzy/nausea/hot sensation and provided seated rest, symptoms improved. Educated pt on importance to sharing those symptoms with staff or his spouse at home when mobilizing to ensure safety. EOS pt repositioned with ice on LE and agreeable to remain in recliner. Will continue to progress as able.   Recommendations for follow up therapy are one component of a multi-disciplinary discharge planning process, led by the attending physician.  Recommendations may be updated based on patient status, additional functional criteria and insurance authorization.  Follow Up Recommendations       Assistance Recommended at Discharge Intermittent Supervision/Assistance  Patient can return home with the following A little help with walking and/or transfers;A little help with bathing/dressing/bathroom;Assistance with cooking/housework;Assist for transportation;Help with stairs or ramp for entrance   Equipment Recommendations  Rolling walker (2 wheels);BSC/3in1    Recommendations for Other Services       Precautions / Restrictions Precautions Precautions: Fall Restrictions Weight Bearing  Restrictions: Yes RLE Weight Bearing: Non weight bearing     Mobility  Bed Mobility Overal bed mobility: Needs Assistance Bed Mobility: Supine to Sit     Supine to sit: Supervision, HOB elevated     General bed mobility comments: sup for safety    Transfers Overall transfer level: Needs assistance Equipment used: Rolling walker (2 wheels) Transfers: Sit to/from Stand Sit to Stand: Min guard           General transfer comment: pt maintained NWB on Rt LE with sit<>stand from EOB. safe reach back to control lowering to recliner when dizzy during ambulation.    Ambulation/Gait Ambulation/Gait assistance: Min guard Gait Distance (Feet): 40 Feet Assistive device: Rolling walker (2 wheels) Gait Pattern/deviations: Step-through pattern, Decreased weight shift to right, Knee flexed in stance - right (hop through; Rt LE NWB) Gait velocity: decr     General Gait Details: pt completed hop to/through pattern and maintained NWB on Rt LE throughout. pt maintained safe proximity to RW without cues needed. Pt reported dizzy/nausea/hot sensation and seated rest provided with symptoms resolving with LE's elevated.   Stairs             Wheelchair Mobility    Modified Rankin (Stroke Patients Only)       Balance Overall balance assessment: Needs assistance Sitting-balance support: Feet supported, No upper extremity supported Sitting balance-Leahy Scale: Good Sitting balance - Comments: able to don sock on Lt foot   Standing balance support: Bilateral upper extremity supported, During functional activity, Reliant on assistive device for balance Standing balance-Leahy Scale: Poor Standing balance comment: requires walker to balance  Cognition Arousal/Alertness: Awake/alert Behavior During Therapy: WFL for tasks assessed/performed Overall Cognitive Status: Within Functional Limits for tasks assessed                                           Exercises General Exercises - Lower Extremity Ankle Circles/Pumps: Limitations, Left, 10 reps Ankle Circles/Pumps Limitations: wiggle toes on Rt foot Quad Sets: AROM, Both, 10 reps    General Comments        Pertinent Vitals/Pain Pain Assessment Pain Assessment: Faces Faces Pain Scale: Hurts little more Pain Location: R foot and ankle Pain Descriptors / Indicators: Grimacing, Sore, Throbbing Pain Intervention(s): Limited activity within patient's tolerance, Monitored during session, Repositioned, Ice applied    Home Living Family/patient expects to be discharged to:: Private residence Living Arrangements: Spouse/significant other Available Help at Discharge: Family Type of Home: House Home Access: Level entry       Home Layout: One level Home Equipment: None      Prior Function            PT Goals (current goals can now be found in the care plan section) Acute Rehab PT Goals Patient Stated Goal: to walk and get home PT Goal Formulation: With patient/family Time For Goal Achievement: 10/28/22 Potential to Achieve Goals: Good    Frequency    Min 5X/week      PT Plan      Co-evaluation              AM-PAC PT "6 Clicks" Mobility   Outcome Measure  Help needed turning from your back to your side while in a flat bed without using bedrails?: None Help needed moving from lying on your back to sitting on the side of a flat bed without using bedrails?: A Little Help needed moving to and from a bed to a chair (including a wheelchair)?: A Little Help needed standing up from a chair using your arms (e.g., wheelchair or bedside chair)?: A Little Help needed to walk in hospital room?: A Little Help needed climbing 3-5 steps with a railing? : A Lot 6 Click Score: 18    End of Session Equipment Utilized During Treatment: Gait belt Activity Tolerance: Patient tolerated treatment well Patient left: in chair;with call bell/phone within  reach;with chair alarm set Nurse Communication: Mobility status PT Visit Diagnosis: Other abnormalities of gait and mobility (R26.89)     Time: 1410-1430 PT Time Calculation (min) (ACUTE ONLY): 20 min  Charges:  $Gait Training: 8-22 mins                     Wynn Maudlin, DPT Acute Rehabilitation Services Office [phone]  10/19/22 3:56 PM

## 2022-10-20 LAB — BASIC METABOLIC PANEL
Anion gap: 7 (ref 5–15)
BUN: 18 mg/dL (ref 6–20)
CO2: 27 mmol/L (ref 22–32)
Calcium: 8.3 mg/dL — ABNORMAL LOW (ref 8.9–10.3)
Chloride: 101 mmol/L (ref 98–111)
Creatinine, Ser: 0.9 mg/dL (ref 0.61–1.24)
GFR, Estimated: >60 mL/min (ref >60)
Glucose, Bld: 93 mg/dL (ref 70–99)
Potassium: 4.3 mmol/L (ref 3.5–5.1)
Sodium: 135 mmol/L (ref 135–145)

## 2022-10-20 LAB — CBC
HCT: 31 % — ABNORMAL LOW (ref 39.0–52.0)
Hemoglobin: 11 g/dL — ABNORMAL LOW (ref 13.0–17.0)
MCH: 30.6 pg (ref 26.0–34.0)
MCHC: 35.5 g/dL (ref 30.0–36.0)
MCV: 86.1 fL (ref 80.0–100.0)
Platelets: 165 10*3/uL (ref 150–400)
RBC: 3.6 MIL/uL — ABNORMAL LOW (ref 4.22–5.81)
RDW: 12 % (ref 11.5–15.5)
WBC: 9.2 10*3/uL (ref 4.0–10.5)
nRBC: 0 % (ref 0.0–0.2)

## 2022-10-20 MED ORDER — ASPIRIN 325 MG PO TBEC
325.0000 mg | DELAYED_RELEASE_TABLET | Freq: Every day | ORAL | 0 refills | Status: AC
Start: 2022-10-20 — End: 2022-11-19

## 2022-10-20 MED ORDER — OXYCODONE HCL 15 MG PO TABS: 15.0000 mg | ORAL_TABLET | ORAL | 0 refills | Status: AC | PRN

## 2022-10-20 MED ORDER — METHOCARBAMOL 500 MG PO TABS: 500.0000 mg | ORAL_TABLET | Freq: Four times a day (QID) | ORAL | 0 refills | Status: AC | PRN

## 2022-10-20 MED ORDER — ACETAMINOPHEN 500 MG PO TABS: 500.0000 mg | ORAL_TABLET | Freq: Four times a day (QID) | ORAL | 0 refills | Status: AC | PRN

## 2022-10-20 MED ORDER — BISACODYL 10 MG RE SUPP: 10.0000 mg | Freq: Every day | RECTAL | Status: DC | PRN

## 2022-10-20 MED ORDER — VITAMIN D3 25 MCG PO TABS: 2000.0000 [IU] | ORAL_TABLET | Freq: Every day | ORAL | 0 refills | Status: AC

## 2022-10-20 NOTE — Progress Notes (Signed)
Physical Therapy Treatment Patient Details Name: Antonio Flores MRN: [id number] DOB: 1980/07/05 Today's Date: 10/20/2022   History of Present Illness 42 y.o. male with onset of fall from tree with the tree falling and landing on his feet.  Found to have complex injuries to Rt ankle with tib/fib fractures now s/p Ext Fixation for Rt distal tibia-fibula intra-articular pilon fx on 5/1 and now s/p ORIF with ex fix hardware removal on 5/6.  NO PMHx of note.    PT Comments    Patient resting in bed, reports some pain in Rt LE but agreeable to mobilize with therapy. Supervision for bed mobility. Pt reported dizziness and returned to supine briefly and received meds from RN. Supine>sit completed again and sit<>stand to knee scooter as pt reports he wants to use one. On standing to scooter pt dizzy again and Min assist required to return to sit EOB. Pt reported not eating today and this PT provided orange juice and crackers. Pt completed stand pivot transfer with RW and min guard to move bed>chair. Ambulation completed with RW and chair follow, min guard and pt denied dizziness. Pt ambulated ~100' with no LOB. Pt provided seated rest break and then transferred sit<>stand to knee scooter with min assist/guard and cues for scooter management. Pt self propelled scooter with min guard for ~200' and denied dizziness. EOS pt returned to bed for comfort as he reports its easier to elevated Rt LE. Pt's wife not present and discussed returning for follow up session with spouse for gait and stair training.    Recommendations for follow up therapy are one component of a multi-disciplinary discharge planning process, led by the attending physician.  Recommendations may be updated based on patient status, additional functional criteria and insurance authorization.  Follow Up Recommendations       Assistance Recommended at Discharge Intermittent Supervision/Assistance  Patient can return home with the  following A little help with walking and/or transfers;A little help with bathing/dressing/bathroom;Assistance with cooking/housework;Assist for transportation;Help with stairs or ramp for entrance   Equipment Recommendations  Rolling walker (2 wheels);BSC/3in1    Recommendations for Other Services       Precautions / Restrictions Precautions Precautions: Fall Restrictions Weight Bearing Restrictions: Yes RLE Weight Bearing: Non weight bearing     Mobility  Bed Mobility Overal bed mobility: Needs Assistance Bed Mobility: Supine to Sit, Sit to Supine     Supine to sit: Supervision, HOB elevated Sit to supine: Supervision, HOB elevated   General bed mobility comments: sup for safety    Transfers Overall transfer level: Needs assistance Equipment used: Rolling walker (2 wheels) Transfers: Sit to/from Stand Sit to Stand: Min guard, Min assist           General transfer comment: pt maintained NWB on Rt LE with sit<>stand from EOB. safe reach back to control lowering to recliner when dizzy during ambulation.    Ambulation/Gait Ambulation/Gait assistance: Min guard Gait Distance (Feet): 100 Feet Assistive device: Rolling walker (2 wheels) Gait Pattern/deviations: Step-through pattern, Decreased weight shift to right, Knee flexed in stance - right (hop through; Rt LE NWB) Gait velocity: decr     General Gait Details: pt completed hop to/through pattern and maintained NWB on Rt LE throughout. pt maintained safe proximity to RW without cues needed. pt denied dizziness but fatigued and seated rest provided.   Stairs             Wheelchair Mobility    Modified Rankin (Stroke Patients Only)  Balance Overall balance assessment: Needs assistance Sitting-balance support: Feet supported, No upper extremity supported Sitting balance-Leahy Scale: Good     Standing balance support: Bilateral upper extremity supported, During functional activity, Reliant on  assistive device for balance Standing balance-Leahy Scale: Poor Standing balance comment: requires walker to balance                            Cognition Arousal/Alertness: Awake/alert Behavior During Therapy: WFL for tasks assessed/performed Overall Cognitive Status: Within Functional Limits for tasks assessed                                          Exercises      General Comments        Pertinent Vitals/Pain Pain Assessment Pain Assessment: Faces Faces Pain Scale: Hurts little more Pain Location: R foot and ankle Pain Descriptors / Indicators: Grimacing, Sore, Throbbing Pain Intervention(s): Limited activity within patient's tolerance, Monitored during session, Repositioned    Home Living                          Prior Function            PT Goals (current goals can now be found in the care plan section) Acute Rehab PT Goals Patient Stated Goal: to walk and get home PT Goal Formulation: With patient/family Time For Goal Achievement: 10/28/22 Potential to Achieve Goals: Good Progress towards PT goals: Progressing toward goals    Frequency    Min 5X/week      PT Plan Current plan remains appropriate    Co-evaluation              AM-PAC PT "6 Clicks" Mobility   Outcome Measure  Help needed turning from your back to your side while in a flat bed without using bedrails?: None Help needed moving from lying on your back to sitting on the side of a flat bed without using bedrails?: A Little Help needed moving to and from a bed to a chair (including a wheelchair)?: A Little Help needed standing up from a chair using your arms (e.g., wheelchair or bedside chair)?: A Little Help needed to walk in hospital room?: A Little Help needed climbing 3-5 steps with a railing? : A Lot 6 Click Score: 18    End of Session Equipment Utilized During Treatment: Gait belt Activity Tolerance: Patient tolerated treatment  well Patient left: in chair;with call bell/phone within reach;with chair alarm set Nurse Communication: Mobility status PT Visit Diagnosis: Other abnormalities of gait and mobility (R26.89)     Time: 6578-4696 PT Time Calculation (min) (ACUTE ONLY): 34 min  Charges:  $Gait Training: 8-22 mins $Therapeutic Activity: 8-22 mins                     Wynn Maudlin, DPT Acute Rehabilitation Services Office [phone]  10/20/22 3:33 PM

## 2022-10-20 NOTE — Discharge Summary (Signed)
Orthopaedic Trauma Service (OTS) Discharge Summary   Patient ID: Antonio Flores MRN: [id number] DOB/AGE: 06/19/1980 42 y.o.  Admit date: 10/13/2022 Discharge date: 10/20/2022  Admission Diagnoses: Right closed pilon fracture  Discharge Diagnoses:  Principal Problem:   Closed fracture of right distal tibia   Past Medical History:  Diagnosis Date   Seizure (HCC)    Up until age 78 yrs old     Procedures Performed: 1.  External fixation right leg 2.  Removal of external fixation right leg 3.  Open reduction internal fixation right pilon fracture  Discharged Condition: good  Hospital Course: Patient presented to [facility] emergency department for evaluation of right foot and ankle pain on 10/13/2022 after sustaining about a 10 foot fall from a tree.  Patient found to have right closed pilon fracture.  Orthopedics consulted for evaluation and management.  Patient taken to the operating room by Dr. Steward Drone on 10/13/2022 for external fixation of the right lower extremity.  Patient admitted to the orthopedic service for pain control, therapies, monitoring of the soft tissues.  Due to complex nature of injury, Dr. Steward Drone felt this fracture would best definitively be managed by an orthopedic traumatologist.  Patient taken back to the operating room by Dr. Jena Gauss on 10/18/2022 for open reduction internal fixation of the right pilon fracture with removal of external fixation.  Patient tolerated this well without complications.  Was placed in a short leg splint postoperatively and instructed to be nonweightbearing on the right lower extremity.  Was started on Lovenox for DVT prophylaxis starting on postoperative day #1.  Begin working physical and Occupational Therapy starting on postoperative day #1.  The remainder of patient's hospitalization was dedicated to achieving adequate pain control and increase mobility in order for patient to safely return home On 10/20/2022, the patient was  tolerating diet, working well with therapies, pain well controlled, vital signs stable, dressings clean, dry, intact and felt stable for discharge to home. Patient will follow up as below and knows to call with questions or concerns.     Consults: orthopedic surgery  Significant Diagnostic Studies:   Results for orders placed or performed during the hospital encounter of 10/13/22 (from the past 168 hour(s))  Surgical pcr screen   Collection Time: 10/13/22  3:11 PM   Specimen: Nasal Mucosa; Nasal Swab  Result Value Ref Range   MRSA, PCR NEGATIVE NEGATIVE   Staphylococcus aureus NEGATIVE NEGATIVE  Basic metabolic panel per protocol   Collection Time: 10/13/22  6:31 PM  Result Value Ref Range   Sodium 136 135 - 145 mmol/L   Potassium 3.3 (L) 3.5 - 5.1 mmol/L   Chloride 104 98 - 111 mmol/L   CO2 24 22 - 32 mmol/L   Glucose, Bld 97 70 - 99 mg/dL   BUN 12 6 - 20 mg/dL   Creatinine, Ser 4.09 0.61 - 1.24 mg/dL   Calcium 8.8 (L) 8.9 - 10.3 mg/dL   GFR, Estimated >81 >19 mL/min   Anion gap 8 5 - 15  CBC per protocol   Collection Time: 10/13/22  6:31 PM  Result Value Ref Range   WBC 14.5 (H) 4.0 - 10.5 K/uL   RBC 4.67 4.22 - 5.81 MIL/uL   Hemoglobin 14.4 13.0 - 17.0 g/dL   HCT 14.7 82.9 - 56.2 %   MCV 86.3 80.0 - 100.0 fL   MCH 30.8 26.0 - 34.0 pg   MCHC 35.7 30.0 - 36.0 g/dL   RDW 13.0 86.5 - 78.4 %  Platelets 142 (L) 150 - 400 K/uL   nRBC 0.0 0.0 - 0.2 %  HIV Antibody (routine testing w rflx)   Collection Time: 10/14/22  4:54 AM  Result Value Ref Range   HIV Screen 4th Generation wRfx Non Reactive Non Reactive  VITAMIN D 25 Hydroxy (Vit-D Deficiency, Fractures)   Collection Time: 10/14/22  4:54 AM  Result Value Ref Range   Vit D, 25-Hydroxy 22.50 (L) 30 - 100 ng/mL  CBC   Collection Time: 10/15/22  5:04 AM  Result Value Ref Range   WBC 9.3 4.0 - 10.5 K/uL   RBC 4.00 (L) 4.22 - 5.81 MIL/uL   Hemoglobin 12.3 (L) 13.0 - 17.0 g/dL   HCT 16.1 (L) 09.6 - 04.5 %   MCV 86.3  80.0 - 100.0 fL   MCH 30.8 26.0 - 34.0 pg   MCHC 35.7 30.0 - 36.0 g/dL   RDW 40.9 81.1 - 91.4 %   Platelets 123 (L) 150 - 400 K/uL   nRBC 0.0 0.0 - 0.2 %  CBC   Collection Time: 10/18/22  3:04 PM  Result Value Ref Range   WBC 10.7 (H) 4.0 - 10.5 K/uL   RBC 4.10 (L) 4.22 - 5.81 MIL/uL   Hemoglobin 12.5 (L) 13.0 - 17.0 g/dL   HCT 78.2 (L) 95.6 - 21.3 %   MCV 87.6 80.0 - 100.0 fL   MCH 30.5 26.0 - 34.0 pg   MCHC 34.8 30.0 - 36.0 g/dL   RDW 08.6 57.8 - 46.9 %   Platelets 165 150 - 400 K/uL   nRBC 0.0 0.0 - 0.2 %  Creatinine, serum   Collection Time: 10/18/22  3:04 PM  Result Value Ref Range   Creatinine, Ser 0.92 0.61 - 1.24 mg/dL   GFR, Estimated >62 >95 mL/min  CBC   Collection Time: 10/19/22  2:50 AM  Result Value Ref Range   WBC 12.7 (H) 4.0 - 10.5 K/uL   RBC 3.71 (L) 4.22 - 5.81 MIL/uL   Hemoglobin 11.2 (L) 13.0 - 17.0 g/dL   HCT 28.4 (L) 13.2 - 44.0 %   MCV 85.7 80.0 - 100.0 fL   MCH 30.2 26.0 - 34.0 pg   MCHC 35.2 30.0 - 36.0 g/dL   RDW 10.2 72.5 - 36.6 %   Platelets 159 150 - 400 K/uL   nRBC 0.0 0.0 - 0.2 %  Basic metabolic panel   Collection Time: 10/19/22  2:50 AM  Result Value Ref Range   Sodium 132 (L) 135 - 145 mmol/L   Potassium 4.1 3.5 - 5.1 mmol/L   Chloride 100 98 - 111 mmol/L   CO2 26 22 - 32 mmol/L   Glucose, Bld 113 (H) 70 - 99 mg/dL   BUN 18 6 - 20 mg/dL   Creatinine, Ser 4.40 0.61 - 1.24 mg/dL   Calcium 8.4 (L) 8.9 - 10.3 mg/dL   GFR, Estimated >34 >74 mL/min   Anion gap 6 5 - 15  CBC   Collection Time: 10/20/22  1:46 AM  Result Value Ref Range   WBC 9.2 4.0 - 10.5 K/uL   RBC 3.60 (L) 4.22 - 5.81 MIL/uL   Hemoglobin 11.0 (L) 13.0 - 17.0 g/dL   HCT 25.9 (L) 56.3 - 87.5 %   MCV 86.1 80.0 - 100.0 fL   MCH 30.6 26.0 - 34.0 pg   MCHC 35.5 30.0 - 36.0 g/dL   RDW 64.3 32.9 - 51.8 %   Platelets 165 150 - 400 K/uL  nRBC 0.0 0.0 - 0.2 %  Basic metabolic panel   Collection Time: 10/20/22  1:46 AM  Result Value Ref Range   Sodium 135 135 - 145  mmol/L   Potassium 4.3 3.5 - 5.1 mmol/L   Chloride 101 98 - 111 mmol/L   CO2 27 22 - 32 mmol/L   Glucose, Bld 93 70 - 99 mg/dL   BUN 18 6 - 20 mg/dL   Creatinine, Ser 1.61 0.61 - 1.24 mg/dL   Calcium 8.3 (L) 8.9 - 10.3 mg/dL   GFR, Estimated >09 >60 mL/min   Anion gap 7 5 - 15     Treatments: IV hydration, antibiotics: Ancef, analgesia: acetaminophen, Dilaudid, Toradol and oxycodone, anticoagulation: LMW heparin, therapies: PT and OT, and surgery: As above  Discharge Exam: General: Resting comfortably in bed, no acute distress Respiratory: No increased work of breathing.  Right lower extremity: Well-padded, well-fitting splint in place.  Nontender above the splint.  Able to wiggle toes.  Endorses sensation to light touch over all toes.  Toes warm and well-perfused.  Remainder of motor and sensory exam to the foot and ankle limited secondary to splint placement.  Disposition: Discharge disposition: 01-Home or Self Care       Discharge Instructions     Call MD / Call 911   Complete by: As directed    If you experience chest pain or shortness of breath, CALL 911 and be transported to the hospital emergency room.  If you develope a fever above 101 F, pus (white drainage) or increased drainage or redness at the wound, or calf pain, call your surgeon's office.   Constipation Prevention   Complete by: As directed    Drink plenty of fluids.  Prune juice may be helpful.  You may use a stool softener, such as Colace (over the counter) 100 mg twice a day.  Use MiraLax (over the counter) for constipation as needed.   Diet - low sodium heart healthy   Complete by: As directed    Increase activity slowly as tolerated   Complete by: As directed    Post-operative opioid taper instructions:   Complete by: As directed    POST-OPERATIVE OPIOID TAPER INSTRUCTIONS: It is important to wean off of your opioid medication as soon as possible. If you do not need pain medication after your surgery it  is ok to stop day one. Opioids include: Codeine, Hydrocodone(Norco, Vicodin), Oxycodone(Percocet, oxycontin) and hydromorphone amongst others.  Long term and even short term use of opiods can cause: Increased pain response Dependence Constipation Depression Respiratory depression And more.  Withdrawal symptoms can include Flu like symptoms Nausea, vomiting And more Techniques to manage these symptoms Hydrate well Eat regular healthy meals Stay active Use relaxation techniques(deep breathing, meditating, yoga) Do Not substitute Alcohol to help with tapering If you have been on opioids for less than two weeks and do not have pain than it is ok to stop all together.  Plan to wean off of opioids This plan should start within one week post op of your joint replacement. Maintain the same interval or time between taking each dose and first decrease the dose.  Cut the total daily intake of opioids by one tablet each day Next start to increase the time between doses. The last dose that should be eliminated is the evening dose.         Allergies as of 10/20/2022   No Known Allergies      Medication List  STOP taking these medications    HYDROcodone-acetaminophen 5-325 MG tablet Commonly known as: NORCO/VICODIN   ibuprofen 600 MG tablet Commonly known as: ADVIL   predniSONE 10 MG tablet Commonly known as: DELTASONE   traMADol 50 MG tablet Commonly known as: ULTRAM       TAKE these medications    acetaminophen 500 MG tablet Commonly known as: TYLENOL Take 1-2 tablets (500-1,000 mg total) by mouth every 6 (six) hours as needed for mild pain, fever or headache.   aspirin EC 325 MG tablet Take 1 tablet (325 mg total) by mouth daily.   methocarbamol 500 MG tablet Commonly known as: ROBAXIN Take 1 tablet (500 mg total) by mouth every 6 (six) hours as needed for muscle spasms. What changed:  when to take this reasons to take this   oxyCODONE 15 MG immediate  release tablet Commonly known as: ROXICODONE Take 1 tablet (15 mg total) by mouth every 4 (four) hours as needed for severe pain.   vitamin D3 25 MCG tablet Commonly known as: CHOLECALCIFEROL Take 2 tablets (2,000 Units total) by mouth daily. Start taking on: Oct 21, 2022               Durable Medical Equipment  (From admission, onward)           Start     Ordered   10/20/22 1003  For home use only DME Other see comment  Once       Comments: Scooter  Question:  Length of Need  Answer:  6 Months   10/20/22 1003   10/20/22 0958  For home use only DME Bedside commode  Once       Comments: Confine to one room  Question:  Patient needs a bedside commode to treat with the following condition  Answer:  Gait instability   10/20/22 0957   10/14/22 1356  For home use only DME Walker rolling  Once       Question Answer Comment  Walker: With 5 Inch Wheels   Patient needs a walker to treat with the following condition Weakness      10/14/22 1356   10/14/22 1356  For home use only DME 3 n 1  Once        10/14/22 1356            Follow-up Information     Haddix, Gillie Manners, MD. Schedule an appointment as soon as possible for a visit in 2 week(s).   Specialty: Orthopedic Surgery Why: for splint/suture removal and repeat x-rays Contact information: [address] [place] [address] [phone]                 Discharge Instructions and Plan: Patient will be discharged to home. Will be discharged on Aspirin for DVT prophylaxis. Patient has been provided with all the necessary DME for discharge. Patient will follow up with Dr. Jena Gauss in 2 weeks for repeat x-rays and suture removal.   Signed:  Thompson Caul, PA-C ?([phone]? (phone) 10/20/2022, 11:22 AM  [facility] [address] [place] [address] [phone] Collier Bullock (F)

## 2022-10-20 NOTE — TOC Progression Note (Addendum)
Transition of Care [facility]) - Progression Note    Patient Details  Name: Markevius Dieleman MRN: [id number] Date of Birth: Sep 10, 1980  Transition of [facility]) CM/SW Contact  Epifanio Lesches, RN Phone Number: 10/20/2022, 10:11 AM  Clinical Narrative:    Per provider potential d/c for today. Referral made with [facility] for [facility] and KNEE SCOOTER, [phone]. Wife states will get RW from [place], insurance will not cover both RW AND SCOOTER. Pt wants insurance to cover scooter. Knee scooter and BSC will be delivered to bedside prior to d/c. Pt without med concern. Wife to assist with care once d/c. Pt with transportation to home once d/c ready.   TOC  team following....  Expected Discharge Plan: Home/Self Care    Expected Discharge Plan and Services   Discharge Planning Services: CM Consult   Living arrangements for the past 2 months: Single Family Home                 DME Arranged: Other see comment (knee scooter, pt states will get RW from [employer]) DME Agency: [employer] Date DME Agency Contacted: 10/20/22 Time DME Agency Contacted: 1010 Representative spoke with at DME Agency: Barbara Cower             Social Determinants of Health (SDOH) Interventions SDOH Screenings   Tobacco Use: High Risk (10/18/2022)    Readmission Risk Interventions     No data to display

## 2022-10-20 NOTE — Discharge Instructions (Signed)
Truitt Merle, MD Ulyses Southward PA-C [facility] [address] [phone] (tel)   [phone] (fax)                                  POST-OPERATIVE INSTRUCTIONS     WEIGHT BEARING STATUS: Non-weightbearing right lower extremity  RANGE OF MOTION/ACTIVITY:  Ok for knee range of motion as tolerated  WOUND CARE Please keep splint clean dry and intact until follow-up. If your splint gets wet for any reason please contact the office immediately.  Do not stick anything down your splint such as pencils, momey, hangers to try and scratch yourself.  If you feel itchy take Benadryl as prescribed on the bottle for itching You may shower on Post-Op Day #2.  You must keep splint dry during this process and may find that a plastic bag taped around the extremity or alternatively a towel based bath may be a better option.   If you get your splint wet or if it is damaged please contact our clinic.  EXERCISES Due to your splint being in place you will not be able to bear weight through your extremity.   DO NOT PUT ANY WEIGHT ON YOUR OPERATIVE LEG Please use crutches or a walker to avoid weight bearing.   DVT/PE prophylaxis: Aspirin  DIET: As you were eating previously.  Can use over the counter stool softeners and bowel preparations, such as Miralax, to help with bowel movements.  Narcotics can be constipating.  Be sure to drink plenty of fluids  REGIONAL ANESTHESIA (NERVE BLOCKS) The anesthesia team may have performed a nerve block for you if safe in the setting of your care.  This is a great tool used to minimize pain.  Typically the block may start wearing off overnight but the long acting medicine may last for 3-4 days.  The nerve block wearing off can be a challenging period but please utilize your as needed pain medications to try and manage this period.    POST-OP MEDICATIONS- Multimodal approach to pain control  In general your pain will be controlled with a  combination of substances.  Prescriptions unless otherwise discussed are electronically sent to your pharmacy.  This is a carefully made plan we use to minimize narcotic use.     - Acetaminophen - Non-narcotic pain medicine taken on a scheduled basis   - Oxycodone - This is a strong narcotic, to be used only on an "as needed" basis for pain.  -  Aspirin 325mg  - This medicine is used to minimize the risk of blood clots after surgery.   FOLLOW-UP If you develop a Fever (>101.5), Redness or Drainage from the surgical incision site, please call our office to arrange for an evaluation. Please call the office to schedule a follow-up appointment for your incision check if you do not already have one, 7-10 days post-operatively.   VISIT OUR WEBSITE FOR ADDITIONAL INFORMATION: [email or web address]   HELPFUL INFORMATION  If you had a block, it will wear off between 8-24 hrs postop typically.  This is period when your pain may go from nearly zero to the pain you would have had postop without the block.  This is an abrupt transition but nothing dangerous is happening.  You may take an extra dose of narcotic when this happens.  You should wean off your narcotic medicines as soon as you are able.  Most patients will be off  or using minimal narcotics before their first postop appointment.   We suggest you use the pain medication the first night prior to going to bed, in order to ease any pain when the anesthesia wears off. You should avoid taking pain medications on an empty stomach as it will make you nauseous.  Do not drink alcoholic beverages or take illicit drugs when taking pain medications.  In most states it is against the law to drive while you are in a splint or sling.  And certainly against the law to drive while taking narcotics.  You may return to work/school in the next couple of days when you feel up to it.   Pain medication may make you constipated.  Below are a few solutions to try in this  order: Decrease the amount of pain medication if you aren't having pain. Drink lots of decaffeinated fluids. Drink prune juice and/or each dried prunes  If the first 3 don't work start with additional solutions Take Colace - an over-the-counter stool softener Take Senokot - an over-the-counter laxative Take Miralax - a stronger over-the-counter laxative

## 2022-10-20 NOTE — Progress Notes (Signed)
Physical Therapy Treatment Patient Details Name: Antonio Flores MRN: [id number] DOB: 1981-02-09 Today's Date: 10/20/2022   History of Present Illness 42 y.o. male with onset of fall from tree with the tree falling and landing on his feet.  Found to have complex injuries to Rt ankle with tib/fib fractures now s/p Ext Fixation for Rt distal tibia-fibula intra-articular pilon fx on 5/1 and now s/p ORIF with ex fix hardware removal on 5/6.  NO PMHx of note.    PT Comments    Patient requesting bathroom at start of session. Denied dizziness throughout and requesting bathroom at start. Pt completed transfers with guard/supervision with RW, and min guard to amb to bathroom and back. Spouse present and provided safe guarding with gait for ~100', pt steady and ambulated to ortho gym. On arrival pt and spouse report no stair to enter home or in home; stair training deferred as pt has no need. EOS pt requesting to remain in recliner, call bell available. Will continue to progress during stay.   Recommendations for follow up therapy are one component of a multi-disciplinary discharge planning process, led by the attending physician.  Recommendations may be updated based on patient status, additional functional criteria and insurance authorization.  Follow Up Recommendations       Assistance Recommended at Discharge Intermittent Supervision/Assistance  Patient can return home with the following A little help with walking and/or transfers;A little help with bathing/dressing/bathroom;Assistance with cooking/housework;Assist for transportation;Help with stairs or ramp for entrance   Equipment Recommendations  Rolling walker (2 wheels);BSC/3in1    Recommendations for Other Services       Precautions / Restrictions Precautions Precautions: Fall Restrictions Weight Bearing Restrictions: Yes RLE Weight Bearing: Non weight bearing     Mobility  Bed Mobility Overal bed mobility: Needs  Assistance Bed Mobility: Supine to Sit, Sit to Supine     Supine to sit: Supervision, HOB elevated Sit to supine: Supervision, HOB elevated   General bed mobility comments: pt at EOB and then OOB in recliner    Transfers Overall transfer level: Needs assistance Equipment used: Rolling walker (2 wheels) Transfers: Sit to/from Stand Sit to Stand: Min guard, Supervision           General transfer comment: supervision/guard for sit<>stand EOB and recliner, no cues for use of RW/safety    Ambulation/Gait Ambulation/Gait assistance: Min guard, Supervision Gait Distance (Feet): 100 Feet Assistive device: Rolling walker (2 wheels) Gait Pattern/deviations: Step-through pattern, Decreased weight shift to right, Knee flexed in stance - right (hop through; Rt LE NWB) Gait velocity: decr     General Gait Details: pt maintained safe position to RW throughout. ambulated short bout to bathroom and back to EOB. spouse present and provided safe gaurding with cues and supervision from therapist for hallway ambulation.   Stairs             Wheelchair Mobility    Modified Rankin (Stroke Patients Only)       Balance Overall balance assessment: Needs assistance Sitting-balance support: Feet supported, No upper extremity supported Sitting balance-Leahy Scale: Good     Standing balance support: Bilateral upper extremity supported, During functional activity, Reliant on assistive device for balance Standing balance-Leahy Scale: Poor Standing balance comment: requires walker to balance                            Cognition Arousal/Alertness: Awake/alert Behavior During Therapy: WFL for tasks assessed/performed Overall Cognitive Status: Within Functional Limits  for tasks assessed                                          Exercises      General Comments        Pertinent Vitals/Pain Pain Assessment Pain Assessment: Faces Faces Pain Scale: Hurts  little more Pain Location: R foot and ankle Pain Descriptors / Indicators: Grimacing, Sore, Throbbing Pain Intervention(s): Limited activity within patient's tolerance, Monitored during session    Home Living                          Prior Function            PT Goals (current goals can now be found in the care plan section) Acute Rehab PT Goals Patient Stated Goal: to walk and get home PT Goal Formulation: With patient/family Time For Goal Achievement: 10/28/22 Potential to Achieve Goals: Good Progress towards PT goals: Progressing toward goals    Frequency    Min 5X/week      PT Plan Current plan remains appropriate    Co-evaluation              AM-PAC PT "6 Clicks" Mobility   Outcome Measure  Help needed turning from your back to your side while in a flat bed without using bedrails?: None Help needed moving from lying on your back to sitting on the side of a flat bed without using bedrails?: A Little Help needed moving to and from a bed to a chair (including a wheelchair)?: A Little Help needed standing up from a chair using your arms (e.g., wheelchair or bedside chair)?: A Little Help needed to walk in hospital room?: A Little Help needed climbing 3-5 steps with a railing? : A Lot 6 Click Score: 18    End of Session Equipment Utilized During Treatment: Gait belt Activity Tolerance: Patient tolerated treatment well Patient left: in chair;with call bell/phone within reach;with chair alarm set Nurse Communication: Mobility status PT Visit Diagnosis: Other abnormalities of gait and mobility (R26.89)     Time: 1359-1410 PT Time Calculation (min) (ACUTE ONLY): 11 min  Charges:  $Gait Training: 8-22 mins $Therapeutic Activity: 8-22 mins                     Wynn Maudlin, DPT Acute Rehabilitation Services Office [phone]  10/20/22 3:46 PM

## 2022-10-22 ENCOUNTER — Encounter (HOSPITAL_COMMUNITY): Payer: Self-pay | Admitting: Student

## 2022-11-18 ENCOUNTER — Ambulatory Visit: Payer: Managed Care, Other (non HMO)

## 2022-11-22 ENCOUNTER — Ambulatory Visit: Payer: Managed Care, Other (non HMO)

## 2022-11-25 ENCOUNTER — Ambulatory Visit: Payer: Managed Care, Other (non HMO) | Attending: Student

## 2022-11-25 ENCOUNTER — Other Ambulatory Visit: Payer: Self-pay

## 2022-11-25 DIAGNOSIS — R262 Difficulty in walking, not elsewhere classified: Secondary | ICD-10-CM | POA: Insufficient documentation

## 2022-11-25 DIAGNOSIS — R29898 Other symptoms and signs involving the musculoskeletal system: Secondary | ICD-10-CM | POA: Diagnosis present

## 2022-11-25 DIAGNOSIS — M25571 Pain in right ankle and joints of right foot: Secondary | ICD-10-CM | POA: Insufficient documentation

## 2022-11-25 DIAGNOSIS — Z9889 Other specified postprocedural states: Secondary | ICD-10-CM | POA: Insufficient documentation

## 2022-11-25 DIAGNOSIS — Z8781 Personal history of (healed) traumatic fracture: Secondary | ICD-10-CM | POA: Insufficient documentation

## 2022-11-25 NOTE — Therapy (Addendum)
OUTPATIENT PHYSICAL THERAPY LOWER EXTREMITY EVALUATION   Patient Name: Antonio Flores MRN: [id number] DOB:[birth year], 42 y.o., male Today's Date: 11/25/2022  END OF SESSION:  PT End of Session - 11/25/22 1416     Visit Number 1    Date for PT Re-Evaluation 01/20/23    PT Start Time 1418    PT Stop Time 1505    PT Time Calculation (min) 47 min    Activity Tolerance Patient tolerated treatment well    Behavior During Therapy [facility] for tasks assessed/performed             Past Medical History:  Diagnosis Date   Seizure (HCC)    Up until age 58 yrs old   Past Surgical History:  Procedure Laterality Date   EXTERNAL FIXATION LEG Right 10/13/2022   Procedure: EXTERNAL FIXATION ANKLE;  Surgeon: [name], MD;  Location: [facility] OR;  Service: Orthopedics;  Laterality: Right;   EXTERNAL FIXATION REMOVAL  10/18/2022   Procedure: REMOVAL EXTERNAL FIXATION LEG;  Surgeon: Roby Lofts, MD;  Location: [facility] OR;  Service: Orthopedics;;   OPEN REDUCTION INTERNAL FIXATION (ORIF) TIBIA/FIBULA FRACTURE Right 10/18/2022   Procedure: OPEN REDUCTION INTERNAL FIXATION (ORIF) PILON FRACTURE;  Surgeon: Roby Lofts, MD;  Location: [facility] OR;  Service: Orthopedics;  Laterality: Right;   Patient Active Problem List   Diagnosis Date Noted   Closed fracture of right distal tibia 10/13/2022    PCP: none  REFERRING PROVIDER: Val Riles, MD  REFERRING DIAG: s/p R distal tib fib fx with ORIF on 10/18/22  THERAPY DIAG:  Status post open reduction with internal fixation (ORIF) of fracture of ankle  Difficulty in walking, not elsewhere classified  Pain in right ankle and joints of right foot  Ankle weakness  Rationale for Evaluation and Treatment: Rehabilitation  ONSET DATE: 10/13/22 injury and external fixation, 10/18/22 ORIF  SUBJECTIVE:   SUBJECTIVE STATEMENT: Pain at night.  I take boot off at night and sleep in bed and it hurts. Also boot is too tall hits my knee  PERTINENT  HISTORY: Injured at home when tree fell on R ankle, suffered pilon fx.  Now 5 weeks post op for initial PT appt  PAIN:  Are you having pain? Yes: NPRS scale: 5/10 Pain location: R ant ankle Pain description: sharp with certain movements Aggravating factors: dorsiflexion active, sleeping Relieving factors: apply boot, doesn't want to take pain meds  PRECAUTIONS: Other: NWB and in boot until cleared, except for therex  WEIGHT BEARING RESTRICTIONS: Yes NWB R, in boot  FALLS:  Has patient fallen in last 6 months? Yes. Number of falls one when injured by tree  LIVING ENVIRONMENT: Lives with: lives with their family and lives with their spouse Lives in: House/apartment Stairs: No Has following equipment at home:  knee scooter, crutches  OCCUPATION: works in Aeronautical engineer  PLOF: Independent  PATIENT GOALS: resume normal life style  NEXT MD VISIT: 3 weeks, week of July 9  OBJECTIVE:   DIAGNOSTIC FINDINGS: NA  PATIENT SURVEYS:  LEFS 9/80  COGNITION: Overall cognitive status: Within functional limits for tasks assessed     SENSATION: WFL  POSTURE/OBS:  utilizing boot R ankle.  Skin R ankle intact.  Diffuse loss of muscle mass R calve, foot intrinsics. Normal temperature and skin turgor.  Well healing scars lat ankle PALPATION: Non tender R plantar foot did not palpate ankle jt  LOWER EXTREMITY ROM:  Active ROM Right Eval all wnl X ankle Left Eval all wnl  Hip flexion  Hip extension    Hip abduction    Hip adduction    Hip internal rotation    Hip external rotation    Knee flexion    Knee extension    Ankle dorsiflexion -28   Ankle plantarflexion 42   Ankle inversion 14   Ankle eversion 13      LOWER EXTREMITY MMT: all wnl, x R  ankle and foot NT Able to flex all 5 digits R foot, 2+ /5 strength for IN, EV plantar flexion.  Dorsiflexion painful with very light resistance subtalar region    FUNCTIONAL TESTS: none today due to NWB status R  ankle   GAIT: Distance walked: 100' in clinic Assistive device utilized:  knee scooter Level of assistance: Modified independence   TODAY'S TREATMENT:                                                                                                                              DATE: 11/25/22   Initiated HEP as documented below: PATIENT EDUCATION:  Education details: POC goals Person educated: Patient and Spouse Education method: Explanation, Facilities manager, Actor cues, Verbal cues, and Handouts Education comprehension: verbalized understanding, returned demonstration, verbal cues required, tactile cues required, and needs further education  HOME EXERCISE PROGRAM: Access Code: [email or web address] URL: [email or web address] Date: 11/25/2022 Prepared by: Linton Rump Kare Dado  Exercises - Towel Scrunches  - 2 x daily - 7 x weekly - 1 sets - 10 reps - Ankle Alphabet in Elevation  - 2 x daily - 7 x weekly - 1 sets - 10 reps - Seated Heel Raise  - 2 x daily - 7 x weekly - 1 sets - 10 reps - Ankle Dorsiflexion with Resistance  - 2 x daily - 7 x weekly - 1 sets - 10 reps - Ankle Inversion with Resistance  - 2 x daily - 7 x weekly - 1 sets - 10 reps - Ankle and Toe Plantarflexion with Resistance  - 2 x daily - 7 x weekly - 1 sets - 10 reps - Ankle Eversion with Resistance  - 2 x daily - 7 x weekly - 1 sets - 10 reps  ASSESSMENT:  CLINICAL IMPRESSION: Patient is a 42 y.o. male who was seen today for physical therapy evaluation and treatment for initiation of recovery of function R ankle following comminuted distal tib/ fib fx and ORIF. He is 5 weeks post op.  He is limited as expected with R ankle ROM, strength, and activity tolerance . He has normal sensation and was able to recruit his ankle musculature in all planes.  He demonstrated minimal edema. He is typically very active, will need to have odd balance and function to return to his type of work.  He will benefit from skilled PT to address and  assist him with his recovery of function R ankle  OBJECTIVE IMPAIRMENTS: Abnormal gait, decreased balance, decreased endurance, difficulty walking, decreased ROM, decreased strength, and pain.   ACTIVITY  LIMITATIONS: carrying, lifting, standing, squatting, stairs, transfers, bathing, dressing, locomotion level, and caring for others  PARTICIPATION LIMITATIONS: meal prep, cleaning, laundry, driving, shopping, community activity, occupation, and yard work  PERSONAL FACTORS: Time since onset of injury/illness/exacerbation are also affecting patient's functional outcome.   REHAB POTENTIAL: Good  CLINICAL DECISION MAKING: Stable/uncomplicated  EVALUATION COMPLEXITY: Low   GOALS: Goals reviewed with patient? Yes  SHORT TERM GOALS: Target date: 2 weeks 11/25/22 I HEP Baseline:established today Goal status: INITIAL    LONG TERM GOALS: Target date: 01/20/23  LEFS 40/80 Baseline: 9/80 Goal status: INITIAL  2.  Improve R ankle dorsiflexion to 10 degrees, pf 45, inv/ev 10 degrees Baseline: limited all directions Goal status: INITIAL  3.  Strength R ankle all directions/ planes 4/5 for improved gait, transfer, balance ability Baseline: 2+/5 all directions Goal status: INITIAL  4.  Able to unilaterally balance on R LE 30 sec for balance Baseline: not assessed due to NWB status Goal status: INITIAL    PLAN:  PT FREQUENCY: 2x/week  PT DURATION: 12 weeks  PLANNED INTERVENTIONS: Therapeutic exercises, Therapeutic activity, Neuromuscular re-education, Balance training, Gait training, Patient/Family education, Self Care, and Joint mobilization  PLAN FOR NEXT SESSION: assess pain, any increased edema , progress with manual techniques, modalities as needed,    Krimson Massmann L Ellinor Test, PT 11/25/2022, 5:27 PM   12/30/22:  this patient did not show for for 3 scheduled appts after eval.  We did call again at request of surgeon to reschedule but he has not returned these calls.  DC from PT at this  time. Merel Santoli Frazier Richards, PT, DPT Board-certified Specialist in Orthopaedic Physical Therapy

## 2022-11-29 ENCOUNTER — Ambulatory Visit: Payer: Managed Care, Other (non HMO) | Admitting: Physical Therapy

## 2022-12-02 ENCOUNTER — Ambulatory Visit: Payer: Managed Care, Other (non HMO)

## 2022-12-06 ENCOUNTER — Ambulatory Visit: Payer: Managed Care, Other (non HMO)

## 2022-12-09 ENCOUNTER — Encounter: Payer: Managed Care, Other (non HMO) | Admitting: Physical Therapy

## 2022-12-13 ENCOUNTER — Encounter: Payer: Managed Care, Other (non HMO) | Admitting: Physical Therapy

## 2023-05-19 IMAGING — CR DG TIBIA/FIBULA 2V*R*
4 series · 4 of 4 positions shown · non-contrast
Comparison: None Available.

CLINICAL DATA: Fall, right leg pain

EXAM:
RIGHT TIBIA AND FIBULA - 2 VIEW

[t tib/fib ap right (1 of 2)]
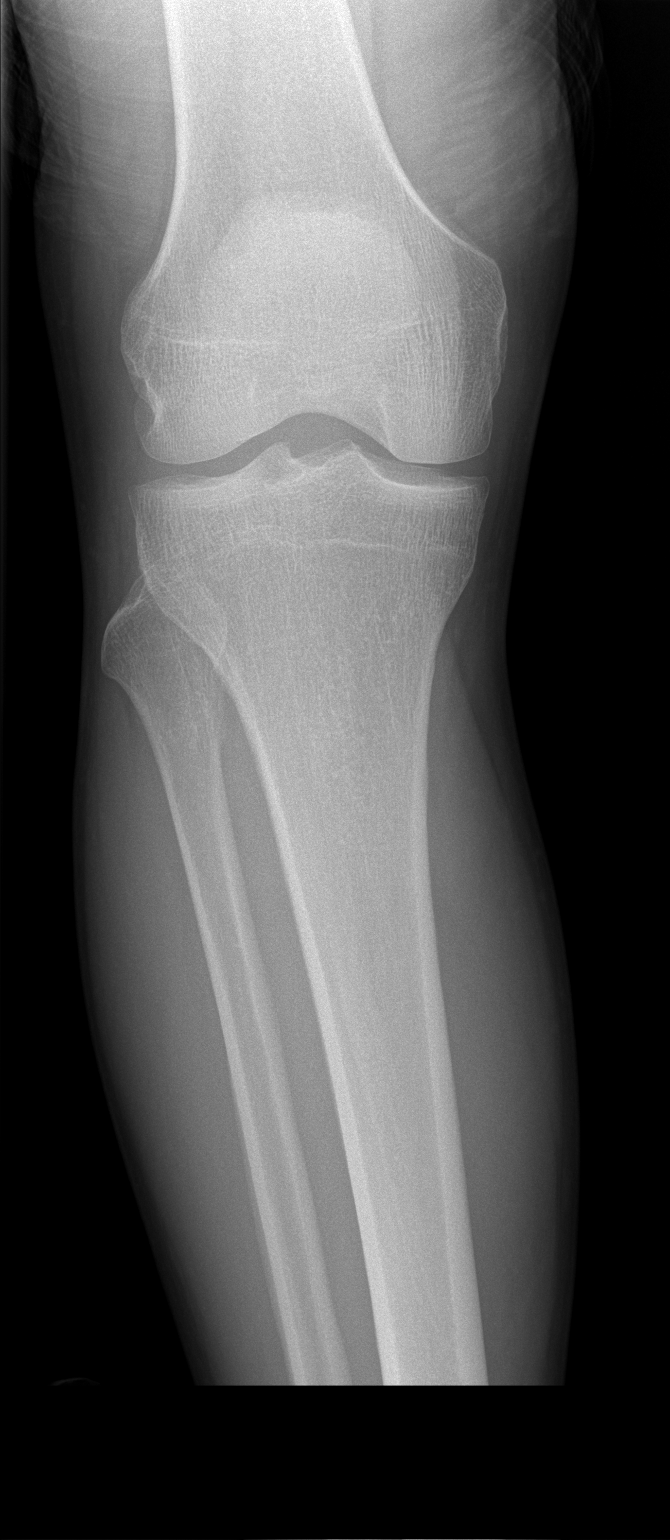

[t tib/fib ap right (2 of 2)]
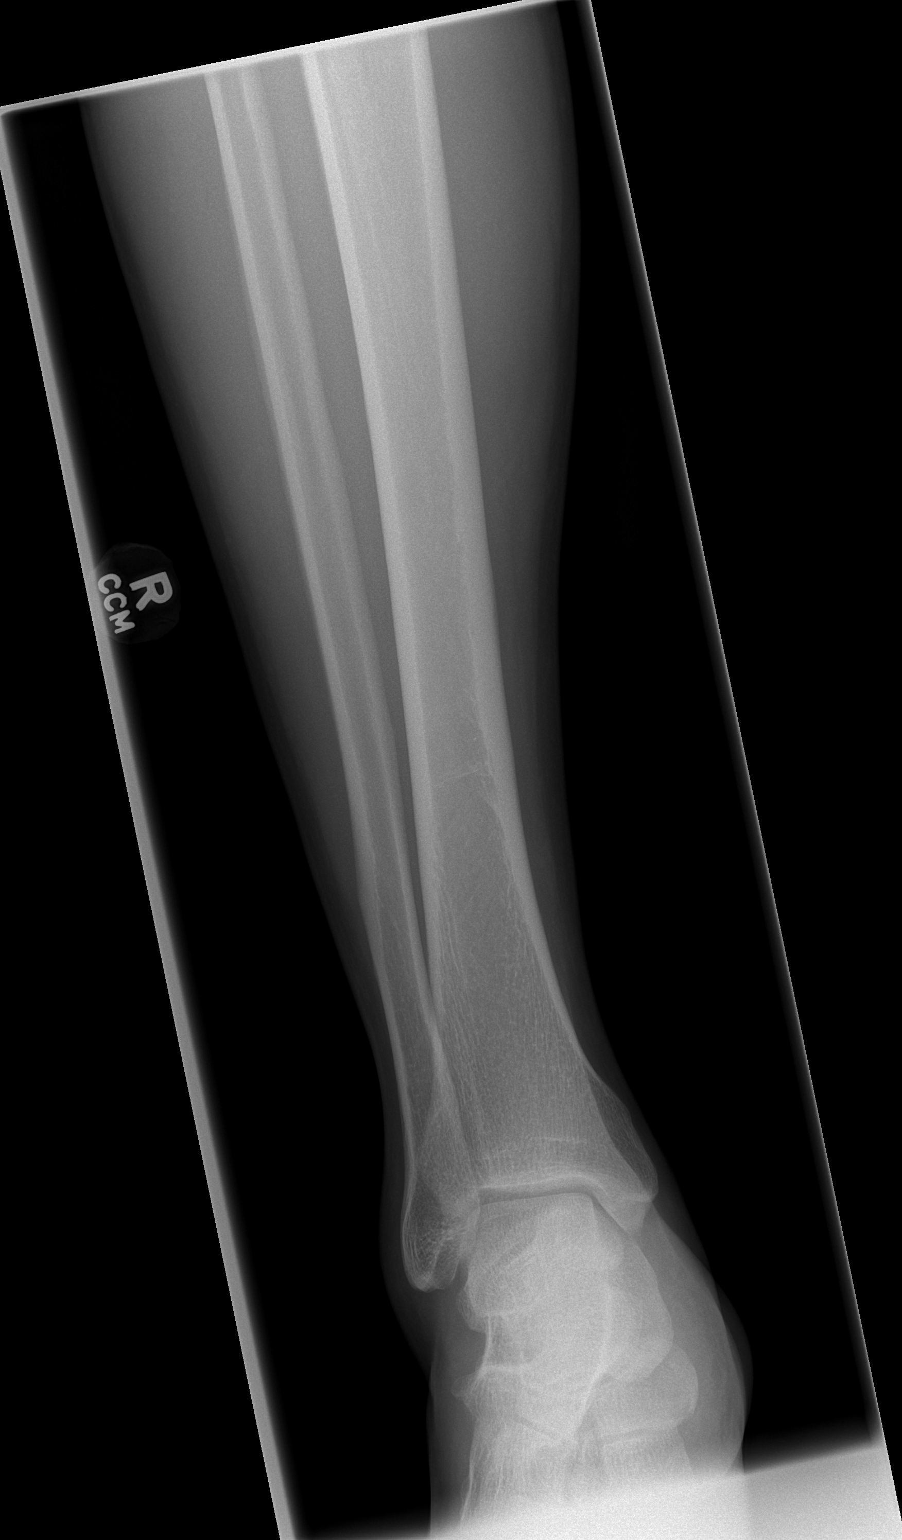

[t tib/fib lat right (1 of 2)]
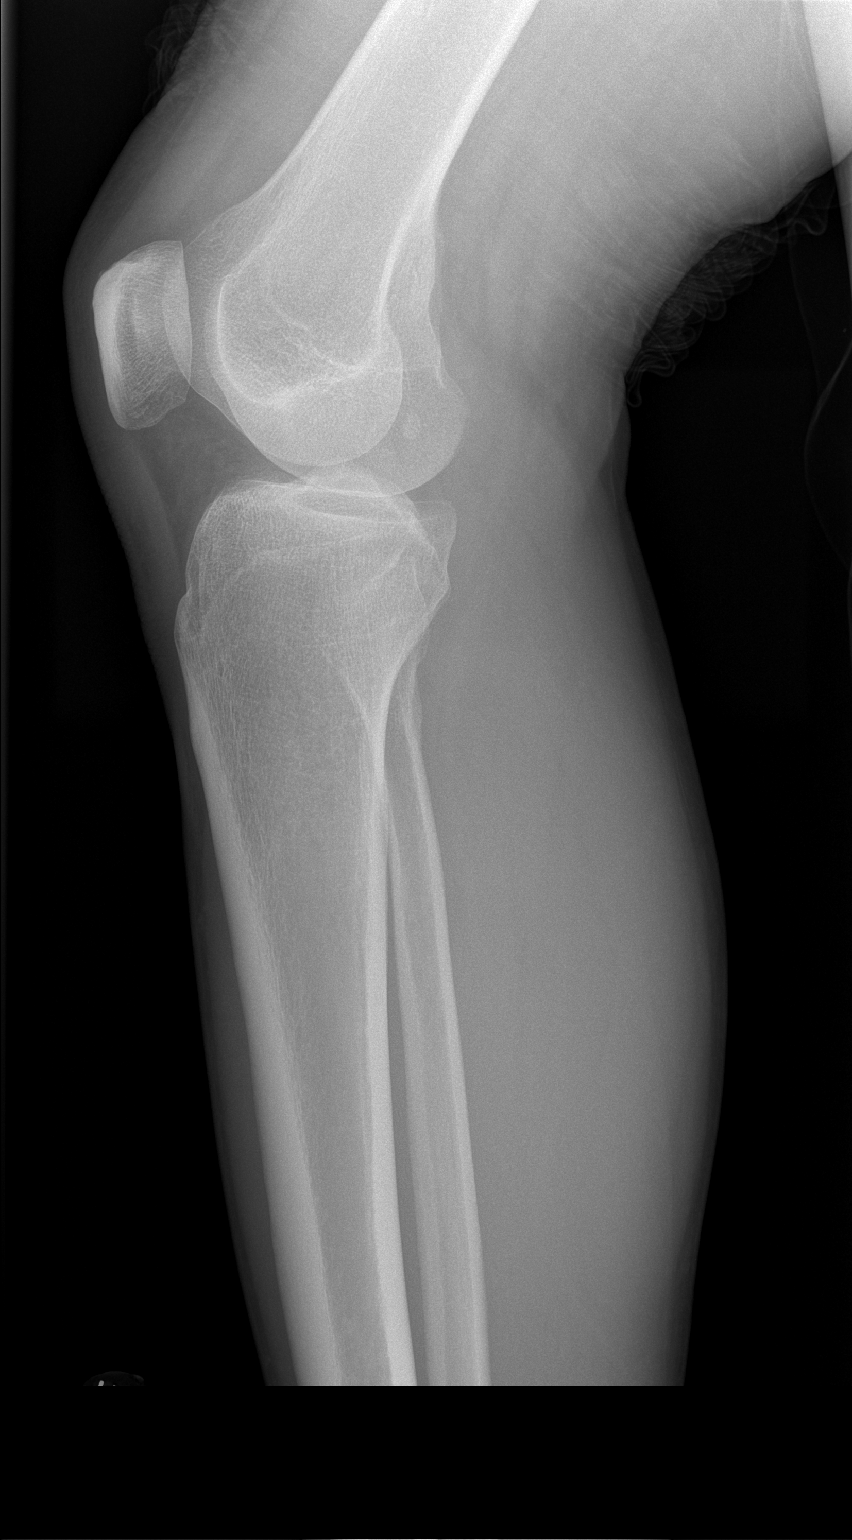

[t tib/fib lat right (2 of 2)]
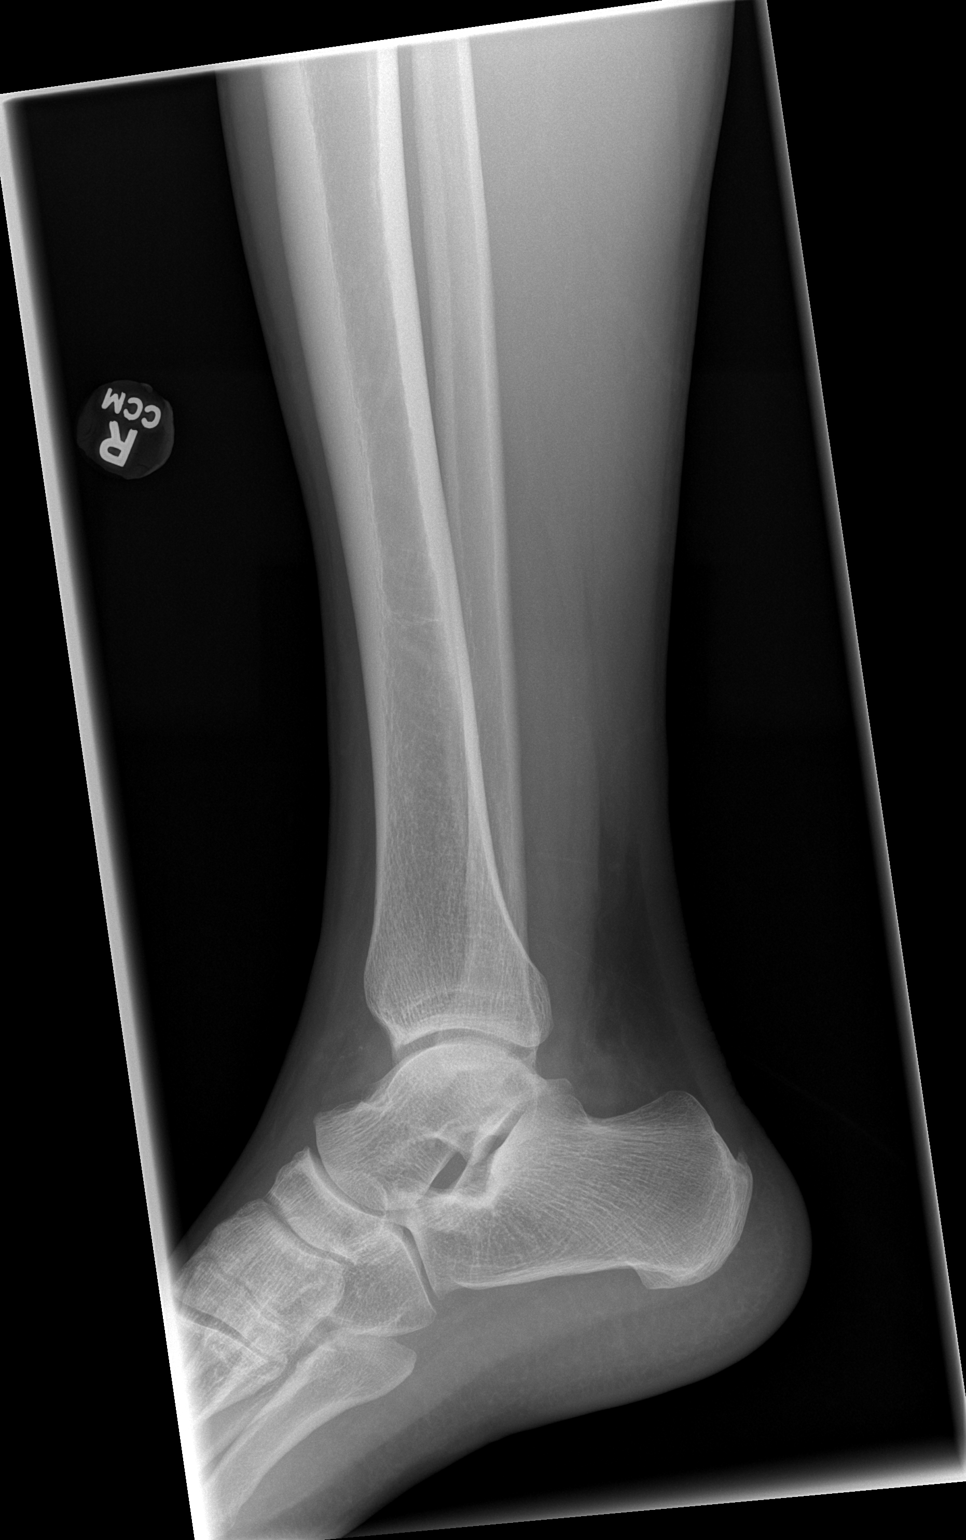

[4 of 4 positions shown; findings below may reference images not displayed]

FINDINGS: No fracture or dislocation is seen.

The joint spaces are preserved.

Visualized soft tissues are within normal limits.

No suprapatellar knee joint effusion.
IMPRESSION: Negative.

## 2023-12-05 ENCOUNTER — Other Ambulatory Visit: Payer: Self-pay

## 2023-12-05 ENCOUNTER — Emergency Department (HOSPITAL_BASED_OUTPATIENT_CLINIC_OR_DEPARTMENT_OTHER): Payer: Self-pay

## 2023-12-05 ENCOUNTER — Encounter (HOSPITAL_BASED_OUTPATIENT_CLINIC_OR_DEPARTMENT_OTHER): Payer: Self-pay

## 2023-12-05 DIAGNOSIS — S81802D Unspecified open wound, left lower leg, subsequent encounter: Secondary | ICD-10-CM | POA: Insufficient documentation

## 2023-12-05 DIAGNOSIS — W268XXD Contact with other sharp object(s), not elsewhere classified, subsequent encounter: Secondary | ICD-10-CM | POA: Insufficient documentation

## 2023-12-05 DIAGNOSIS — Z23 Encounter for immunization: Secondary | ICD-10-CM | POA: Insufficient documentation

## 2023-12-05 LAB — BASIC METABOLIC PANEL WITH GFR
Anion gap: 13 (ref 5–15)
BUN: 15 mg/dL (ref 6–20)
CO2: 24 mmol/L (ref 22–32)
Calcium: 9.6 mg/dL (ref 8.9–10.3)
Chloride: 104 mmol/L (ref 98–111)
Creatinine, Ser: 0.92 mg/dL (ref 0.61–1.24)
GFR, Estimated: >60 mL/min (ref >60)
Glucose, Bld: 123 mg/dL — ABNORMAL HIGH (ref 70–99)
Potassium: 3.6 mmol/L (ref 3.5–5.1)
Sodium: 141 mmol/L (ref 135–145)

## 2023-12-05 LAB — CBC WITH DIFFERENTIAL/PLATELET
Abs Immature Granulocytes: 0.04 10*3/uL (ref 0.00–0.07)
Basophils Absolute: 0 10*3/uL (ref 0.0–0.1)
Basophils Relative: 0 %
Eosinophils Absolute: 0.1 10*3/uL (ref 0.0–0.5)
Eosinophils Relative: 1 %
HCT: 43.7 % (ref 39.0–52.0)
Hemoglobin: 15.4 g/dL (ref 13.0–17.0)
Immature Granulocytes: 0 %
Lymphocytes Relative: 24 %
Lymphs Abs: 2.1 10*3/uL (ref 0.7–4.0)
MCH: 30.3 pg (ref 26.0–34.0)
MCHC: 35.2 g/dL (ref 30.0–36.0)
MCV: 85.9 fL (ref 80.0–100.0)
Monocytes Absolute: 0.6 10*3/uL (ref 0.1–1.0)
Monocytes Relative: 7 %
Neutro Abs: 6.1 10*3/uL (ref 1.7–7.7)
Neutrophils Relative %: 68 %
Platelets: 170 10*3/uL (ref 150–400)
RBC: 5.09 MIL/uL (ref 4.22–5.81)
RDW: 12.3 % (ref 11.5–15.5)
WBC: 9 10*3/uL (ref 4.0–10.5)
nRBC: 0 % (ref 0.0–0.2)

## 2023-12-05 NOTE — ED Triage Notes (Signed)
 Pt presents with a wound on his L shin where he cut himself with a chainsaw 2 days prior. Today he was referred to the ED from [facility] due to concerns for infection. Pt denies fever, N/V.

## 2023-12-06 ENCOUNTER — Emergency Department (HOSPITAL_BASED_OUTPATIENT_CLINIC_OR_DEPARTMENT_OTHER)
Admission: EM | Admit: 2023-12-06 | Discharge: 2023-12-06 | Disposition: A | Discharge status: Home / Self Care | Payer: Self-pay | Attending: Emergency Medicine | Admitting: Emergency Medicine

## 2023-12-06 DIAGNOSIS — L089 Local infection of the skin and subcutaneous tissue, unspecified: Secondary | ICD-10-CM

## 2023-12-06 MED ORDER — CLINDAMYCIN HCL 150 MG PO CAPS
450.0000 mg | ORAL_CAPSULE | Freq: Once | ORAL | Status: AC
  Administered 2023-12-06: 450 mg via ORAL
  Filled 2023-12-06: qty 3

## 2023-12-06 MED ORDER — BACITRACIN ZINC 500 UNIT/GM EX OINT
TOPICAL_OINTMENT | Freq: Two times a day (BID) | CUTANEOUS | Status: DC
  Administered 2023-12-06: 31.5 via TOPICAL
  Filled 2023-12-06: qty 28.35

## 2023-12-06 MED ORDER — CLINDAMYCIN HCL 150 MG PO CAPS: 450.0000 mg | ORAL_CAPSULE | Freq: Three times a day (TID) | ORAL | 0 refills | Status: AC

## 2023-12-06 MED ORDER — TETANUS-DIPHTH-ACELL PERTUSSIS 5-2.5-18.5 LF-MCG/0.5 IM SUSY
0.5000 mL | PREFILLED_SYRINGE | Freq: Once | INTRAMUSCULAR | Status: AC
  Administered 2023-12-06: 0.5 mL via INTRAMUSCULAR
  Filled 2023-12-06: qty 0.5

## 2023-12-06 NOTE — ED Provider Notes (Signed)
 [facility] Provider Note   CSN: [id number] Arrival date & time: 12/05/23  2215     Patient presents with: Wound Check   Antonio Flores is a 43 y.o. male.   The history is provided by the patient.  Wound Check  Antonio Flores is a 43 y.o. male who presents to the Emergency Department complaining of wound infection.  He presents to the emergency department for evaluation of increased swelling and pain to his left shin.  He states he cut it 4 days ago with a chainsaw.  Yesterday it started to become red and swollen and he went to urgent care, and was referred to the emergency department.  No associated fever.  No significant drainage.  Tetanus is unknown.  No known medical problems.  He did wash it with peroxide and alcohol yesterday.      Prior to Admission medications   Medication Sig Start Date End Date Taking? Authorizing Provider  clindamycin (CLEOCIN) 150 MG capsule Take 3 capsules (450 mg total) by mouth 3 (three) times daily for 7 days. 12/06/23 12/13/23 Yes Griselda Norris, MD  acetaminophen  (TYLENOL ) 500 MG tablet Take 1-2 tablets (500-1,000 mg total) by mouth every 6 (six) hours as needed for mild pain, fever or headache. 10/20/22   Danton Lauraine LABOR, PA-C  methocarbamol  (ROBAXIN ) 500 MG tablet Take 1 tablet (500 mg total) by mouth every 6 (six) hours as needed for muscle spasms. 10/20/22   Danton Lauraine LABOR, PA-C  oxyCODONE  (ROXICODONE ) 15 MG immediate release tablet Take 1 tablet (15 mg total) by mouth every 4 (four) hours as needed for severe pain. 10/20/22   Danton Lauraine LABOR, PA-C    Allergies: Patient has no known allergies.    Review of Systems  All other systems reviewed and are negative.   Updated Vital Signs BP 116/82   Pulse 63   Temp 98.1 F (36.7 C)   Resp 20   Ht 5' 10 (1.778 m)   Wt 91.2 kg   SpO2 100%   BMI 28.84 kg/m   Physical Exam Vitals and nursing note reviewed.  Constitutional:       Appearance: He is well-developed.  HENT:     Head: Normocephalic and atraumatic.   Cardiovascular:     Rate and Rhythm: Normal rate and regular rhythm.  Pulmonary:     Effort: Pulmonary effort is normal. No respiratory distress.   Musculoskeletal:        General: No tenderness.     Comments: 2.5 cm laceration to mid shin with about 3 cm of surrounding erythema and edema.  No significant drainage.  2 + DP pulses.    Skin:    General: Skin is warm and dry.   Neurological:     Mental Status: He is alert and oriented to person, place, and time.   Psychiatric:        Behavior: Behavior normal.     (all labs ordered are listed, but only abnormal results are displayed) Labs Reviewed  BASIC METABOLIC PANEL WITH GFR - Abnormal; Notable for the following components:      Result Value   Glucose, Bld 123 (*)    All other components within normal limits  CBC WITH DIFFERENTIAL/PLATELET    EKG: None  Radiology: DG Tibia/Fibula Left Result Date: 12/05/2023 CLINICAL DATA:  Traumatic injury EXAM: LEFT TIBIA AND FIBULA - 2 VIEW COMPARISON:  None Available. FINDINGS: There is no evidence of fracture or other focal bone  lesions. Soft tissues are unremarkable. IMPRESSION: Negative. Electronically Signed   By: Franky Crease M.D.   On: 12/05/2023 23:24     Procedures   Medications Ordered in the ED  bacitracin ointment (31.5 Applications Topical Given 12/06/23 0140)  Tdap (BOOSTRIX) injection 0.5 mL (0.5 mLs Intramuscular Given 12/06/23 0140)  clindamycin (CLEOCIN) capsule 450 mg (450 mg Oral Given 12/06/23 0139)                                    Medical Decision Making Risk OTC drugs. Prescription drug management.   Patient here for evaluation of redness to a wound to his left shin.  There is no evidence of bony involvement.  There is no abscess.  Wound was cleaned in the emergency department.  Tetanus was updated.  Will start on antibiotics.  Current picture is not consistent  with necrotizing soft tissue infection, abscess.  Discussed home care for wound infection with cellulitis, outpatient follow-up as well as return precautions.     Final diagnoses:  Wound infection    ED Discharge Orders          Ordered    clindamycin (CLEOCIN) 150 MG capsule  3 times daily        12/06/23 0109               Griselda Norris, MD 12/06/23 [phone]

## 2024-05-13 ENCOUNTER — Encounter (HOSPITAL_BASED_OUTPATIENT_CLINIC_OR_DEPARTMENT_OTHER): Payer: Self-pay

## 2024-05-13 ENCOUNTER — Other Ambulatory Visit: Payer: Self-pay

## 2024-05-13 ENCOUNTER — Emergency Department (HOSPITAL_BASED_OUTPATIENT_CLINIC_OR_DEPARTMENT_OTHER)
Admission: EM | Admit: 2024-05-13 | Discharge: 2024-05-13 | Disposition: A | Discharge status: Home / Self Care | Payer: Self-pay | Attending: Emergency Medicine | Admitting: Emergency Medicine

## 2024-05-13 ENCOUNTER — Emergency Department (HOSPITAL_BASED_OUTPATIENT_CLINIC_OR_DEPARTMENT_OTHER): Payer: Self-pay

## 2024-05-13 DIAGNOSIS — R1013 Epigastric pain: Secondary | ICD-10-CM | POA: Insufficient documentation

## 2024-05-13 DIAGNOSIS — R112 Nausea with vomiting, unspecified: Secondary | ICD-10-CM | POA: Insufficient documentation

## 2024-05-13 DIAGNOSIS — Z720 Tobacco use: Secondary | ICD-10-CM | POA: Insufficient documentation

## 2024-05-13 DIAGNOSIS — K625 Hemorrhage of anus and rectum: Secondary | ICD-10-CM | POA: Insufficient documentation

## 2024-05-13 LAB — CBC
HCT: 45 % (ref 39.0–52.0)
Hemoglobin: 15.8 g/dL (ref 13.0–17.0)
MCH: 30.7 pg (ref 26.0–34.0)
MCHC: 35.1 g/dL (ref 30.0–36.0)
MCV: 87.4 fL (ref 80.0–100.0)
Platelets: 170 K/uL (ref 150–400)
RBC: 5.15 MIL/uL (ref 4.22–5.81)
RDW: 12 % (ref 11.5–15.5)
WBC: 8.5 K/uL (ref 4.0–10.5)
nRBC: 0 % (ref 0.0–0.2)

## 2024-05-13 LAB — COMPREHENSIVE METABOLIC PANEL WITH GFR
ALT: 27 U/L (ref 0–44)
AST: 21 U/L (ref 15–41)
Albumin: 4.5 g/dL (ref 3.5–5.0)
Alkaline Phosphatase: 100 U/L (ref 38–126)
Anion gap: 10 (ref 5–15)
BUN: 18 mg/dL (ref 6–20)
CO2: 27 mmol/L (ref 22–32)
Calcium: 9.5 mg/dL (ref 8.9–10.3)
Chloride: 103 mmol/L (ref 98–111)
Creatinine, Ser: 0.87 mg/dL (ref 0.61–1.24)
GFR, Estimated: >60 mL/min (ref >60)
Glucose, Bld: 97 mg/dL (ref 70–99)
Potassium: 3.8 mmol/L (ref 3.5–5.1)
Sodium: 139 mmol/L (ref 135–145)
Total Bilirubin: 0.3 mg/dL (ref 0.0–1.2)
Total Protein: 7.2 g/dL (ref 6.5–8.1)

## 2024-05-13 LAB — LIPASE, BLOOD: Lipase: 22 U/L (ref 11–51)

## 2024-05-13 MED ORDER — SODIUM CHLORIDE 0.9 % IV BOLUS
1000.0000 mL | Freq: Once | INTRAVENOUS | Status: AC
  Administered 2024-05-13: 1000 mL via INTRAVENOUS

## 2024-05-13 MED ORDER — MORPHINE SULFATE (PF) 4 MG/ML IV SOLN
4.0000 mg | Freq: Once | INTRAVENOUS | Status: AC
  Administered 2024-05-13: 4 mg via INTRAVENOUS
  Filled 2024-05-13: qty 1

## 2024-05-13 MED ORDER — OMEPRAZOLE 20 MG PO CPDR: 20.0000 mg | DELAYED_RELEASE_CAPSULE | Freq: Every day | ORAL | 3 refills | Status: AC

## 2024-05-13 MED ORDER — IOHEXOL 300 MG/ML  SOLN
100.0000 mL | Freq: Once | INTRAMUSCULAR | Status: AC | PRN
  Administered 2024-05-13: 100 mL via INTRAVENOUS

## 2024-05-13 MED ORDER — PANTOPRAZOLE SODIUM 40 MG IV SOLR
40.0000 mg | Freq: Once | INTRAVENOUS | Status: AC
  Administered 2024-05-13: 40 mg via INTRAVENOUS
  Filled 2024-05-13: qty 10

## 2024-05-13 MED ORDER — ONDANSETRON 4 MG PO TBDP: 4.0000 mg | ORAL_TABLET | Freq: Three times a day (TID) | ORAL | 0 refills | Status: AC | PRN

## 2024-05-13 MED ORDER — ONDANSETRON HCL 4 MG/2ML IJ SOLN
4.0000 mg | Freq: Once | INTRAMUSCULAR | Status: AC
  Administered 2024-05-13: 4 mg via INTRAVENOUS
  Filled 2024-05-13: qty 2

## 2024-05-13 NOTE — Discharge Instructions (Addendum)
 You had lab work and a CAT scan that did not show an obvious explanation for your symptoms.  I would start with some acid medication like omeprazole or pantoprazole.  This is over-the-counter but also can be a prescription.  See which one is cheaper.  Also prescribing medication for nausea.  Please start with a clear liquid diet advance as tolerated.  Follow-up with your primary care doctor, return if any worsening or concerning symptoms

## 2024-05-13 NOTE — ED Triage Notes (Signed)
 Upper abd pain and light pink rectal bleeding x3 days.

## 2024-05-13 NOTE — ED Provider Notes (Signed)
 [facility] Provider Note   CSN: [id number] Arrival date & time: 05/13/24  1524     Patient presents with: Abdominal Pain and Rectal Bleeding   Antonio Flores is a 43 y.o. male.  He is primarily Spanish-speaking and family member is translating at his request.  He has been having nausea and vomiting, upper abdominal pain for the last 3 days.  He rates it as 5 out of 10 worse with eating.  He has had some brown stools that are formed but have some blood on him.  No fevers or chills.  No sick contacts or recent travel.  He said he had an episode of rectal bleeding in the past that was much more significant and had a colonoscopy and had a polyp removed.  No bleeding since then.  No abdominal surgical history otherwise.  {Add pertinent medical, surgical, social history, OB history to YEP:67052} The history is provided by the spouse and the patient.  Abdominal Pain Pain location:  Epigastric Pain quality: aching   Pain severity:  Moderate Onset quality:  Gradual Duration:  3 days Timing:  Intermittent Progression:  Unchanged Chronicity:  New Relieved by:  Nothing Worsened by:  Eating Associated symptoms: hematochezia, nausea and vomiting   Associated symptoms: no chest pain, no constipation, no cough, no diarrhea, no dysuria, no fever, no hematemesis, no hematuria, no melena and no shortness of breath   Rectal Bleeding Quality: pink. Amount:  Scant Context: defecation   Associated symptoms: abdominal pain and vomiting   Associated symptoms: no fever and no hematemesis   Risk factors: no anticoagulant use        Prior to Admission medications   Medication Sig Start Date End Date Taking? Authorizing Provider  acetaminophen  (TYLENOL ) 500 MG tablet Take 1-2 tablets (500-1,000 mg total) by mouth every 6 (six) hours as needed for mild pain, fever or headache. 10/20/22   Danton Lauraine LABOR, PA-C  methocarbamol  (ROBAXIN ) 500 MG  tablet Take 1 tablet (500 mg total) by mouth every 6 (six) hours as needed for muscle spasms. 10/20/22   Danton Lauraine LABOR, PA-C  oxyCODONE  (ROXICODONE ) 15 MG immediate release tablet Take 1 tablet (15 mg total) by mouth every 4 (four) hours as needed for severe pain. 10/20/22   Danton Lauraine LABOR, PA-C    Allergies: Patient has no known allergies.    Review of Systems  Constitutional:  Negative for fever.  Respiratory:  Negative for cough and shortness of breath.   Cardiovascular:  Negative for chest pain.  Gastrointestinal:  Positive for abdominal pain, hematochezia, nausea and vomiting. Negative for constipation, diarrhea, hematemesis and melena.  Genitourinary:  Negative for dysuria and hematuria.    Updated Vital Signs BP 113/72 (BP Location: Left Arm)   Pulse 79   Temp 98.5 F (36.9 C) (Oral)   Resp 16   Ht 5' 9 (1.753 m)   Wt 93 kg   SpO2 98%   BMI 30.27 kg/m   Physical Exam Vitals and nursing note reviewed.  Constitutional:      Appearance: Normal appearance. He is well-developed.  HENT:     Head: Normocephalic and atraumatic.  Eyes:     Conjunctiva/sclera: Conjunctivae normal.  Cardiovascular:     Rate and Rhythm: Normal rate and regular rhythm.     Heart sounds: No murmur heard. Pulmonary:     Effort: Pulmonary effort is normal. No respiratory distress.     Breath sounds: Normal breath sounds.  Abdominal:  Palpations: Abdomen is soft.     Tenderness: There is abdominal tenderness in the epigastric area. There is no guarding or rebound.  Musculoskeletal:        General: No deformity.     Cervical back: Neck supple.  Skin:    General: Skin is warm and dry.  Neurological:     General: No focal deficit present.     Mental Status: He is alert.     GCS: GCS eye subscore is 4. GCS verbal subscore is 5. GCS motor subscore is 6.     (all labs ordered are listed, but only abnormal results are displayed) Labs Reviewed  LIPASE, BLOOD  COMPREHENSIVE METABOLIC PANEL  WITH GFR  CBC  URINALYSIS, ROUTINE W REFLEX MICROSCOPIC    EKG: None  Radiology: No results found.  {Document cardiac monitor, telemetry assessment procedure when appropriate:32947} Procedures   Medications Ordered in the ED  morphine  (PF) 4 MG/ML injection 4 mg (has no administration in time range)  ondansetron  (ZOFRAN ) injection 4 mg (has no administration in time range)  sodium chloride  0.9 % bolus 1,000 mL (has no administration in time range)  pantoprazole (PROTONIX) injection 40 mg (has no administration in time range)      {Click here for ABCD2, HEART and other calculators REFRESH Note before signing:1}                              Medical Decision Making Amount and/or Complexity of Data Reviewed Labs: ordered.  Risk Prescription drug management.   This patient complains of ***; this involves an extensive number of treatment Options and is a complaint that carries with it a high risk of complications and morbidity. The differential includes ***  I ordered, reviewed and interpreted labs, which included *** I ordered medication *** and reviewed PMP when indicated. I ordered imaging studies which included *** and I independently    visualized and interpreted imaging which showed *** Additional history obtained from *** Previous records obtained and reviewed *** I consulted *** and discussed lab and imaging findings and discussed disposition.  Cardiac monitoring reviewed, *** Social determinants considered, *** Critical Interventions: ***  After the interventions stated above, I reevaluated the patient and found *** Admission and further testing considered, ***   {Document critical care time when appropriate  Document review of labs and clinical decision tools ie CHADS2VASC2, etc  Document your independent review of radiology images and any outside records  Document your discussion with family members, caretakers and with consultants  Document social  determinants of health affecting pt's care  Document your decision making why or why not admission, treatments were needed:32947:::1}   Final diagnoses:  None    ED Discharge Orders     None

## 2024-05-13 NOTE — ED Notes (Signed)
 Pt alert and oriented X 4 at the time of discharge. RR even and unlabored. No acute distress noted. Pt verbalized understanding of discharge instructions as discussed. Pt ambulatory to lobby at time of discharge.
